# Patient Record
Sex: Male | Born: 1990
Health system: Southern US, Community
[De-identification: ages and names within clinical notes are randomized; demographics above are authoritative.]

## PROBLEM LIST (undated history)

## (undated) HISTORY — PX: APPENDECTOMY: SHX54

---

## 2013-06-06 ENCOUNTER — Emergency Department (HOSPITAL_COMMUNITY)
Admission: EM | Admit: 2013-06-06 | Discharge: 2013-06-06 | Disposition: A | Discharge status: Home / Self Care | Payer: Medicaid Other | Attending: Emergency Medicine | Admitting: Emergency Medicine

## 2013-06-06 ENCOUNTER — Encounter (HOSPITAL_COMMUNITY): Payer: Self-pay | Admitting: Emergency Medicine

## 2013-06-06 DIAGNOSIS — F17200 Nicotine dependence, unspecified, uncomplicated: Secondary | ICD-10-CM | POA: Insufficient documentation

## 2013-06-06 DIAGNOSIS — A630 Anogenital (venereal) warts: Secondary | ICD-10-CM | POA: Insufficient documentation

## 2013-06-06 LAB — GC/CHLAMYDIA PROBE AMP
CT Probe RNA: NEGATIVE
GC Probe RNA: NEGATIVE

## 2013-06-06 NOTE — ED Provider Notes (Signed)
CSN: [id number]     Arrival date & time 06/06/13  0122 History   First MD Initiated Contact with Patient 06/06/13 0143     Chief Complaint  Patient presents with  . Exposure to STD     (Consider location/radiation/quality/duration/timing/severity/associated sxs/prior Treatment) HPI History provided by pt.   Pt noticed bumps on his penis ~1 week ago.  Bumps are painful during sexual intercourse.  No associated fever, abdominal pain, testicular pain, urethral discharge or dysuria.  Has a single partner but does not use protection.  She is asymptomatic.  No h/o STDs.  History reviewed. No pertinent past medical history. Past Surgical History  Procedure Laterality Date  . Appendectomy     History reviewed. No pertinent family history. History  Substance Use Topics  . Smoking status: Current Every Day Smoker    Types: Cigarettes  . Smokeless tobacco: Not on file  . Alcohol Use: Yes    Review of Systems  All other systems reviewed and are negative.      Allergies  Review of patient's allergies indicates no known allergies.  Home Medications  No current outpatient prescriptions on file. BP 139/75  Pulse 64  Temp(Src) 98.4 F (36.9 C) (Oral)  Resp 18  SpO2 100% Physical Exam  Nursing note and vitals reviewed. Constitutional: He is oriented to person, place, and time. He appears well-developed and well-nourished. No distress.  HENT:  Head: Normocephalic and atraumatic.  Eyes:  Normal appearance  Neck: Normal range of motion.  Cardiovascular: Normal rate and regular rhythm.   Pulmonary/Chest: Effort normal and breath sounds normal. No respiratory distress.  Abdominal: Soft. Bowel sounds are normal. He exhibits no distension and no mass. There is no tenderness. There is no rebound and no guarding.  Genitourinary:  Multiple pink, flat, asymmetric papules ranging from 2-1064mm.  Non-tender.  Uncircumcised.  No urethral discharge.  Testicles descended bilaterally.  No masses or  tenderness.  No inguinal lymphadenopathy.  No perianal rash.  Musculoskeletal: Normal range of motion.  Neurological: He is alert and oriented to person, place, and time.  Skin: Skin is warm and dry. No rash noted.  Psychiatric: He has a normal mood and affect. His behavior is normal.    ED Course  Procedures (including critical care time) Labs Review Labs Reviewed  GC/CHLAMYDIA PROBE AMP   Imaging Review No results found.  EKG Interpretation   None       MDM   Final diagnoses:  Genital warts    23yo healthy M presents w/ genitalia rash.  Exam most consistent w/ HPV.  GC/chlam culture pending.  Recommended tylenol/motrin prn and f/u w/ Health Dept for further testing and possibly tx of warts.  Also recommended that his girlfriend be evaluated and that they use protection when having intercourse.  Return precautions discussed.     Otilio Miuatherine E Curt Oatis, PA-C 06/06/13 [phone]

## 2013-06-06 NOTE — Discharge Instructions (Signed)
Treat pain w/ motrin or tylenol.  You can alternate these two medications every three hours if necessary.   Follow up with the health department for further STD testing.  They may be able to treat your genital warts as well.  Return to the ER if you develop worsening rash or associated fever or abdominal pain.  Genital Warts Genital warts are caused by a germ (human papillomavirus, HPV). This germ is spread by having unprotected sex (intercourse) with an infected person. It can be spread by vaginal, anal, and oral sex. A person who is infected might not show any signs or problems. HOME CARE  Follow your doctor's treatment instructions.  Do not use medicine that is meant for hand warts. Only take medicine as told by your doctor.  Tell your past and current sex partner(s) that you have genital warts. They may need treatment.  Avoid sexual contact while you are being treated.  Do not touch or scratch the warts. You could spread it to other parts of your body.  Women with genital warts should have a cervical cancer check (Pap test) at least once a year.  Tell your doctor if you become pregnant. The germ can be passed to the baby.  After treatment, use a condom during sex.  Ask your doctor before using anti-itch creams. GET HELP RIGHT AWAY IF:   Your treated skin becomes red, puffy (swollen), or painful.  You have a fever.  You feel generally sick.  You feel little lumps in and around your genital area.  You are bleeding or have pain during sex. MAKE SURE YOU:   Understand these instructions.  Will watch your condition.  Will get help right away if you are not doing well or get worse. Document Released: 06/21/2009 Document Revised: 06/19/2011 Document Reviewed: 10/03/2010 [facility] Patient Information 2014 [place], [place].

## 2013-06-06 NOTE — ED Provider Notes (Signed)
Medical screening examination/treatment/procedure(s) were performed by non-physician practitioner and as supervising physician I was immediately available for consultation/collaboration.    Trent Theisen M Perpetua Elling, MD 06/06/13 0405 

## 2013-06-06 NOTE — ED Notes (Signed)
Pt states he wants to be checked for a std

## 2014-12-18 ENCOUNTER — Encounter (HOSPITAL_COMMUNITY): Payer: Self-pay | Admitting: *Deleted

## 2014-12-18 ENCOUNTER — Emergency Department (HOSPITAL_COMMUNITY)
Admission: EM | Admit: 2014-12-18 | Discharge: 2014-12-18 | Disposition: A | Discharge status: Home / Self Care | Payer: Medicaid Other | Attending: Emergency Medicine | Admitting: Emergency Medicine

## 2014-12-18 DIAGNOSIS — L600 Ingrowing nail: Secondary | ICD-10-CM | POA: Insufficient documentation

## 2014-12-18 DIAGNOSIS — Z720 Tobacco use: Secondary | ICD-10-CM | POA: Insufficient documentation

## 2014-12-18 NOTE — ED Notes (Signed)
Pt reports in-grown toe nail x 10 years.

## 2014-12-18 NOTE — Discharge Instructions (Signed)
Ingrown Toenail An ingrown toenail occurs when the sharp edge of your toenail grows into the skin. Causes of ingrown toenails include toenails clipped too far back or poorly fitting shoes. Activities involving sudden stops (basketball, tennis) causing "toe jamming" may lead to an ingrown nail. HOME CARE INSTRUCTIONS   Soak the whole foot in warm soapy water for 20 minutes, 3 times per day.  You may lift the edge of the nail away from the sore skin by wedging a small piece of cotton under the corner of the nail. Be careful not to dig (traumatize) and cause more injury to the area.  Wear shoes that fit well. While the ingrown nail is causing problems, sandals may be beneficial.  Trim your toenails regularly and carefully. Cut your toenails straight across, not in a curve. This will prevent injury to the skin at the corners of the toenail.  Keep your feet clean and dry.  Crutches may be helpful early in treatment if walking is painful.  Antibiotics, if prescribed, should be taken as directed.  Return for a wound check in 2 days or as directed.  Only take over-the-counter or prescription medicines for pain, discomfort, or fever as directed by your caregiver. SEEK IMMEDIATE MEDICAL CARE IF:   You have a fever.  You have increasing pain, redness, swelling, or heat at the wound site.  Your toe is not better in 7 days. If conservative treatment is not successful, surgical removal of a portion or all of the nail may be necessary. MAKE SURE YOU:   Understand these instructions.  Will watch your condition.  Will get help right away if you are not doing well or get worse. Document Released: 03/24/2000 Document Revised: 06/19/2011 Document Reviewed: 03/18/2008 [facility] Patient Information 2015 [place], [place]. This information is not intended to replace advice given to you by your health care provider. Make sure you discuss any questions you have with your health care provider. : Make an  appointment with the foot Center.  They can take your now off or help you throw it out so it does not curl under  As it is right now.  There is no sign of infection at this time

## 2014-12-18 NOTE — ED Notes (Signed)
Pt assessed and discharged by Kathreen Cornfield

## 2014-12-18 NOTE — ED Provider Notes (Signed)
CSN: [id number]     Arrival date & time 12/18/14  0021 History   First MD Initiated Contact with Patient 12/18/14 0034     No chief complaint on file.    (Consider location/radiation/quality/duration/timing/severity/associated sxs/prior Treatment) HPI Comments: , and states that he's had trouble with his right great toe for the past 10 years.  Tonight, he would like his toenail taken off and he is tired of dealing with.  The history is provided by the patient.    No past medical history on file. Past Surgical History  Procedure Laterality Date  . Appendectomy     No family history on file. Social History  Substance Use Topics  . Smoking status: Current Every Day Smoker    Types: Cigarettes  . Smokeless tobacco: Not on file  . Alcohol Use: Yes    Review of Systems  Constitutional: Negative for fever.  Musculoskeletal: Negative for joint swelling.  Skin: Negative for wound.  All other systems reviewed and are negative.     Allergies  Review of patient's allergies indicates no known allergies.  Home Medications   Prior to Admission medications   Not on File   BP 135/81 mmHg  Pulse 69  Temp(Src) 98.7 F (37.1 C) (Oral)  Resp 18  SpO2 95% Physical Exam  Constitutional: He appears well-developed and well-nourished.  HENT:  Head: Normocephalic.  Eyes: Pupils are equal, round, and reactive to light.  Neck: Normal range of motion.  Cardiovascular: Normal rate.   Pulmonary/Chest: Effort normal.  Musculoskeletal: He exhibits no edema or tenderness.       Feet:  Neurological: He is alert.  Skin: Skin is warm.  Nursing note and vitals reviewed.   ED Course  Procedures (including critical care time) Labs Review Labs Reviewed - No data to display  Imaging Review No results found. I have personally reviewed and evaluated these images and lab results as part of my medical decision-making.   EKG Interpretation None     Patient has been given referral to  [facility]   Final diagnoses:  Ingrown toenail without infection         Earley Favor, NP 12/18/14 0043  April Palumbo, MD 12/18/14 [id number]

## 2018-01-03 ENCOUNTER — Emergency Department (HOSPITAL_BASED_OUTPATIENT_CLINIC_OR_DEPARTMENT_OTHER): Payer: Self-pay

## 2018-01-03 ENCOUNTER — Encounter (HOSPITAL_BASED_OUTPATIENT_CLINIC_OR_DEPARTMENT_OTHER): Payer: Self-pay | Admitting: Emergency Medicine

## 2018-01-03 ENCOUNTER — Other Ambulatory Visit: Payer: Self-pay

## 2018-01-03 ENCOUNTER — Emergency Department (HOSPITAL_BASED_OUTPATIENT_CLINIC_OR_DEPARTMENT_OTHER)
Admission: EM | Admit: 2018-01-03 | Discharge: 2018-01-03 | Disposition: A | Discharge status: Home / Self Care | Payer: Self-pay | Attending: Emergency Medicine | Admitting: Emergency Medicine

## 2018-01-03 DIAGNOSIS — K2100 Gastro-esophageal reflux disease with esophagitis, without bleeding: Secondary | ICD-10-CM

## 2018-01-03 DIAGNOSIS — F17210 Nicotine dependence, cigarettes, uncomplicated: Secondary | ICD-10-CM | POA: Insufficient documentation

## 2018-01-03 DIAGNOSIS — R072 Precordial pain: Secondary | ICD-10-CM | POA: Insufficient documentation

## 2018-01-03 IMAGING — DX DG CHEST 2V
2 series · 2 of 2 positions shown · non-contrast
Comparison: None.

CLINICAL DATA: Intermittent chest pain and nausea for the past 2
days. Smoker.

EXAM:
CHEST - 2 VIEW

[chest pa]
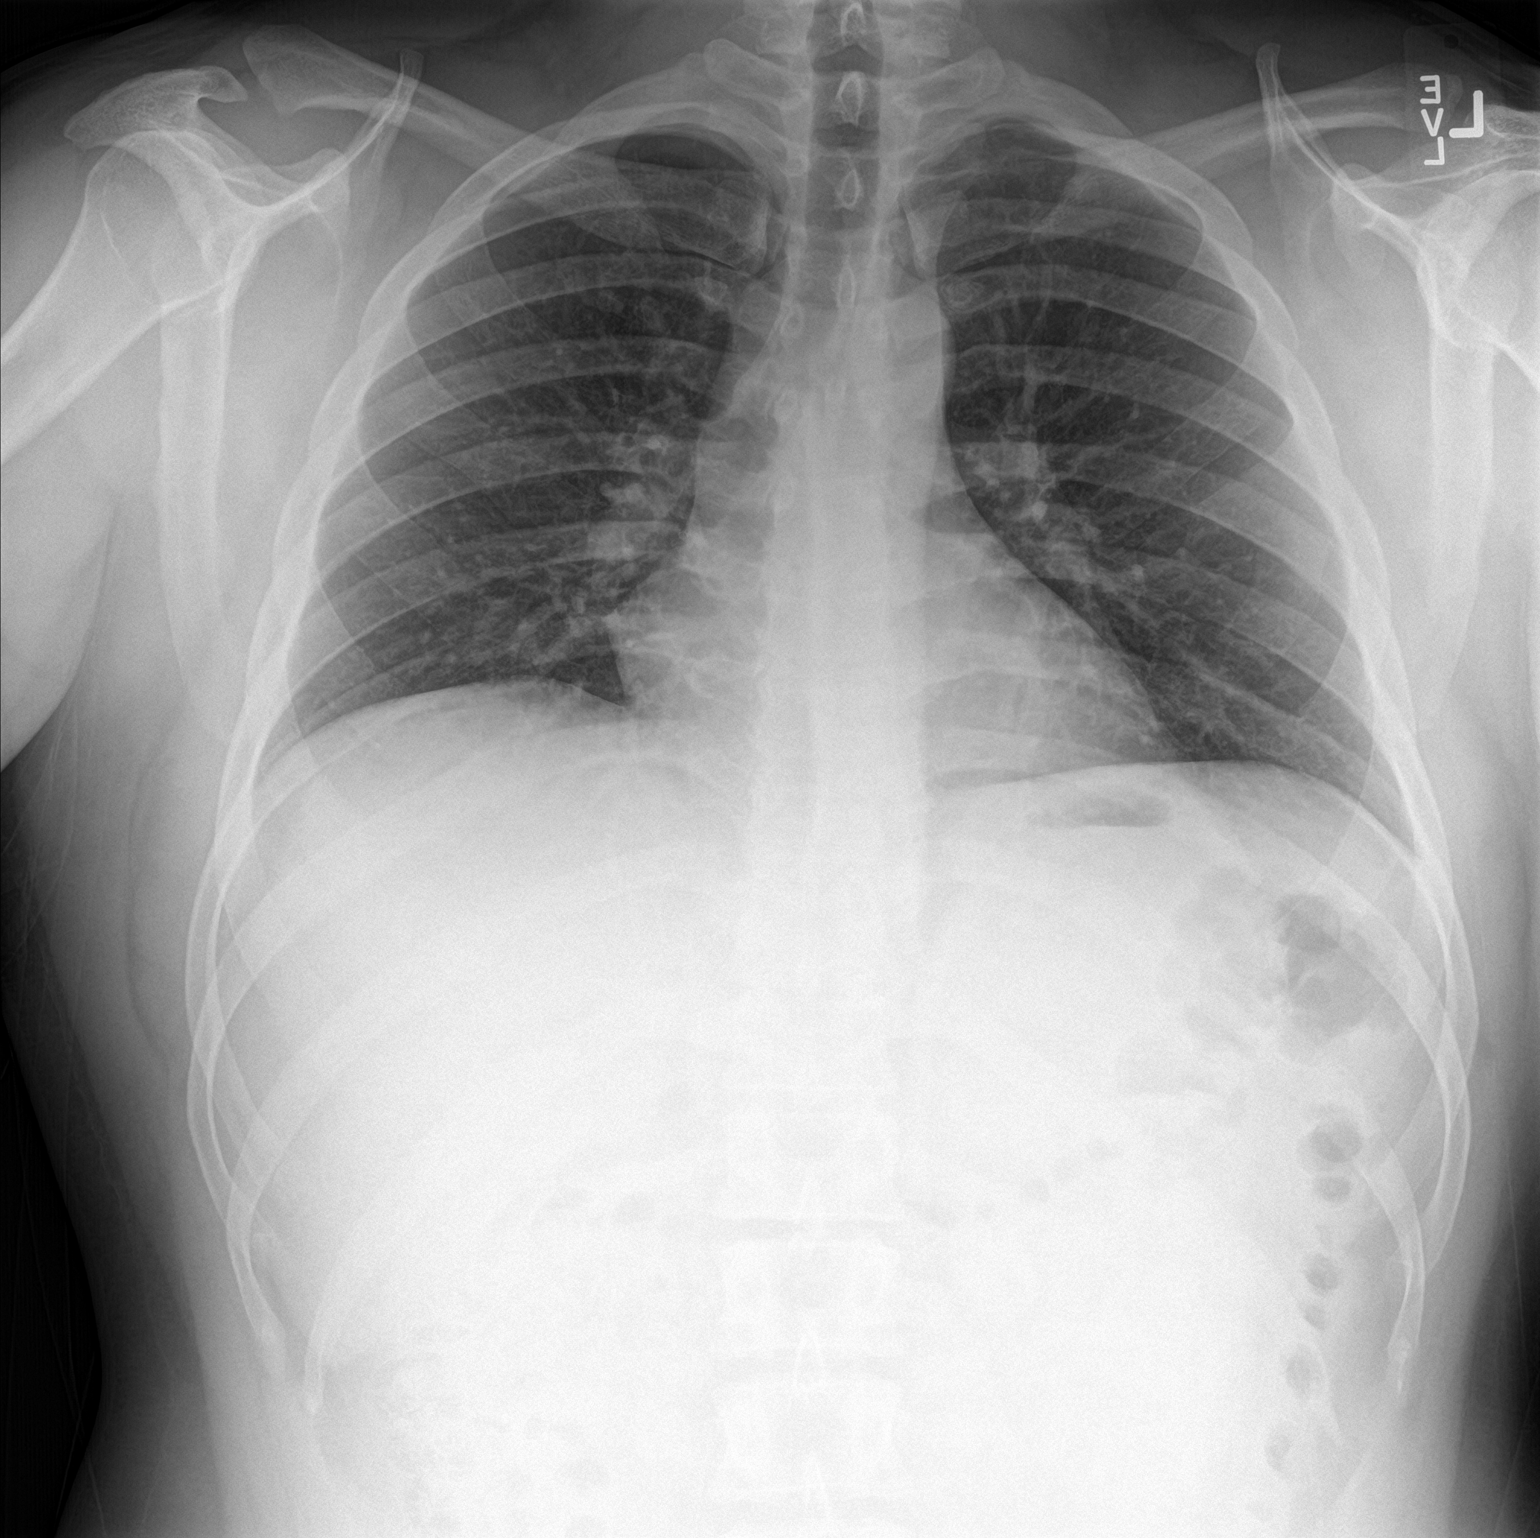

[chest lat]
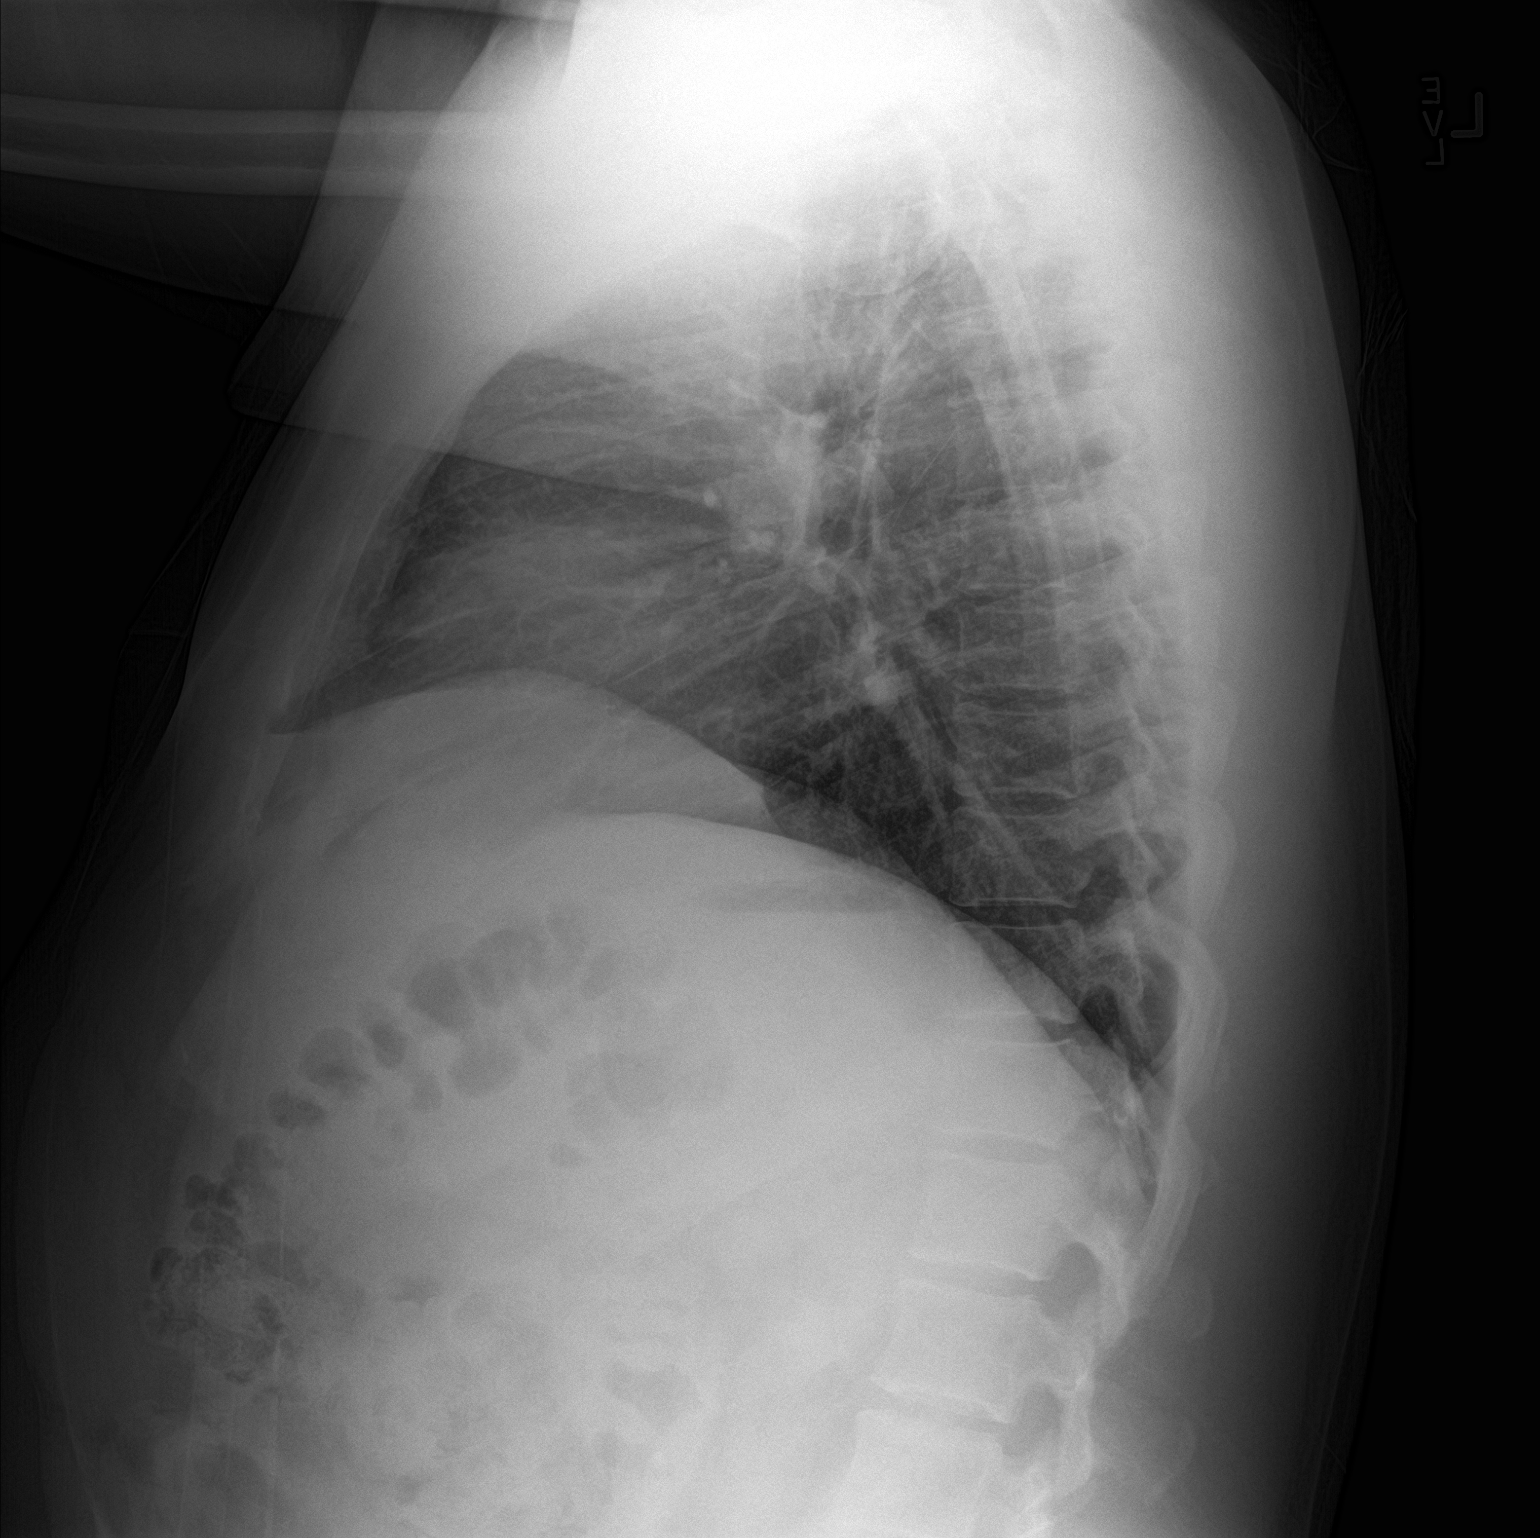

[2 of 2 positions shown; findings below may reference images not displayed]

FINDINGS: Poor inspiration. Normal sized heart. Mild peribronchial thickening.
Clear lungs. Normal appearing bones.
IMPRESSION: Mild bronchitic changes.

## 2018-01-03 MED ORDER — OMEPRAZOLE 20 MG PO CPDR: 20.0000 mg | DELAYED_RELEASE_CAPSULE | Freq: Every day | ORAL | 0 refills | Status: AC

## 2018-01-03 NOTE — ED Triage Notes (Signed)
Intermittent chest pain x2 days with nausea. 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 Denies pain at this time.

## 2018-01-03 NOTE — Discharge Instructions (Addendum)
Please read and follow all provided instructions.  Your diagnoses today include:  1. Gastroesophageal reflux disease with esophagitis   2. Precordial pain    Work up here is reassuring. Chest x-ray did not show concerning findings. EKG was normal. No signs of a heart attack. We suspect you have acid reflux and GERD causing your symptoms. Please take omeprazole for at least two weeks and you can use tums for acute symptoms relief. Please avoid spicy, greasy and fatty foods. Please follow up with primary care or call the [facility] for an appointment.    Tests performed today include: An EKG of your heart A chest x-ray Vital signs. See below for your results today.   Medications prescribed:   Take any prescribed medications only as directed.  Follow-up instructions: Please follow-up with your primary care provider as soon as you can for further evaluation of your symptoms.   Return instructions:  SEEK IMMEDIATE MEDICAL ATTENTION IF: You have severe chest pain, especially if the pain is crushing or pressure-like and spreads to the arms, back, neck, or jaw, or if you have sweating, nausea (feeling sick to your stomach), or shortness of breath. THIS IS AN EMERGENCY. Don't wait to see if the pain will go away. Get medical help at once. Call 911 or 0 (operator). DO NOT drive yourself to the hospital.  Your chest pain gets worse and does not go away with rest.  You have an attack of chest pain lasting longer than usual, despite rest and treatment with the medications your caregiver has prescribed.  You wake from sleep with chest pain or shortness of breath. You feel dizzy or faint. You have chest pain not typical of your usual pain for which you originally saw your caregiver.  You have any other emergent concerns regarding your health.  Additional Information: Chest pain comes from many different causes. Your caregiver has diagnosed you as having chest pain that is not specific for one  problem, but does not require admission.  You are at low risk for an acute heart condition or other serious illness.   Your vital signs today were: BP (!) 168/102 (BP Location: Left Arm)    Pulse 91    Temp 98.3 F (36.8 C) (Oral)    Resp 18    Wt 104.1 kg    SpO2 97%  If your blood pressure (BP) was elevated above 135/85 this visit, please have this repeated by your doctor within one month. --------------

## 2018-01-03 NOTE — ED Provider Notes (Signed)
[facility] EMERGENCY DEPARTMENT Provider Note   CSN: [id number] Arrival date & time: 01/03/18  1739     History   Chief Complaint Chief Complaint  Patient presents with  . Chest Pain    HPI Wesley Contreras is a 27 y.o. male.  Wesley Contreras is a 27 y.o. Male who presents to the ED complaining of intermittent chest pain for several months.  Patient reports he has intermittent abdominal pain and chest pain with associated burping and belching ongoing for several months.  He is not able to identify alleviating or aggravating factors.  He tells me that right now he does not have any symptoms.  He has not been taking any treatments for his symptoms at home.  He has had no coughing or shortness of breath.  He is not a smoker.  He has not had any vomiting or diarrhea.  He has been eating and drinking normally.  No history of MI. No recent long travel. No SOB. No fevers, coughing, shortness of breath, vomiting, diarrhea, rashes, leg pain, leg swelling, syncope.   The history is provided by the patient. No language interpreter was used.  Chest Pain   Associated symptoms include abdominal pain (not currently. ). Pertinent negatives include no back pain, no cough, no fever, no headaches, no nausea, no palpitations, no shortness of breath and no vomiting.    History reviewed. No pertinent past medical history.  There are no active problems to display for this patient.   Past Surgical History:  Procedure Laterality Date  . APPENDECTOMY          Home Medications    Prior to Admission medications   Medication Sig Start Date End Date Taking? Authorizing Provider  omeprazole (PRILOSEC) 20 MG capsule Take 1 capsule (20 mg total) by mouth daily. 01/03/18   Everlene Farrier, PA-C    Family History No family history on file.  Social History Social History   Tobacco Use  . Smoking status: Current Every Day Smoker    Types: Cigarettes  . Smokeless tobacco: Never Used  Substance Use  Topics  . Alcohol use: Yes  . Drug use: No     Allergies   Patient has no known allergies.   Review of Systems Review of Systems  Constitutional: Negative for appetite change, chills and fever.  HENT: Negative for congestion and sore throat.   Eyes: Negative for visual disturbance.  Respiratory: Negative for cough, shortness of breath and wheezing.   Cardiovascular: Positive for chest pain (not currently. ). Negative for palpitations and leg swelling.  Gastrointestinal: Positive for abdominal pain (not currently. ). Negative for diarrhea, nausea and vomiting.  Genitourinary: Negative for dysuria.  Musculoskeletal: Negative for back pain and neck pain.  Skin: Negative for rash.  Neurological: Negative for syncope, light-headedness and headaches.     Physical Exam Updated Vital Signs BP (!) 135/93   Pulse 78   Temp 98.3 F (36.8 C) (Oral)   Resp 12   Wt 104.1 kg   SpO2 97%   Physical Exam  Constitutional: He appears well-developed and well-nourished.  Non-toxic appearance. He does not appear ill. No distress.  HENT:  Head: Normocephalic and atraumatic.  Mouth/Throat: Oropharynx is clear and moist.  Eyes: Pupils are equal, round, and reactive to light. Conjunctivae are normal. Right eye exhibits no discharge. Left eye exhibits no discharge.  Neck: Neck supple.  Cardiovascular: Normal rate, regular rhythm, normal heart sounds and intact distal pulses. Exam reveals no gallop and no  friction rub.  No murmur heard. Bilateral radial, posterior tibialis and dorsalis pedis pulses are intact.    Pulmonary/Chest: Effort normal and breath sounds normal. No respiratory distress. He has no wheezes. He has no rales.  Lungs are clear to ascultation bilaterally. Symmetric chest expansion bilaterally. No increased work of breathing. No rales or rhonchi.    Abdominal: Soft. He exhibits no distension. There is no tenderness. There is no guarding.  Abdomen is soft and nontender to  palpation.  Musculoskeletal: He exhibits no edema.       Right lower leg: Normal. He exhibits no tenderness and no edema.       Left lower leg: Normal. He exhibits no tenderness and no edema.  Lymphadenopathy:    He has no cervical adenopathy.  Neurological: He is alert. Coordination normal.  Skin: Skin is warm and dry. Capillary refill takes less than 2 seconds. No rash noted. He is not diaphoretic. No erythema. No pallor.  Psychiatric: He has a normal mood and affect. His behavior is normal.  Nursing note and vitals reviewed.    ED Treatments / Results  Labs (all labs ordered are listed, but only abnormal results are displayed) Labs Reviewed - No data to display  EKG ED ECG REPORT   Date: 01/03/2018  Rate: 91  Rhythm: normal sinus rhythm  QRS Axis: normal  Intervals: normal  ST/T Wave abnormalities: normal  Conduction Disutrbances:none  Old EKG Reviewed: none available  I have personally reviewed the EKG tracing and agree with the computerized printout as noted.   Radiology Dg Chest 2 View  Result Date: 01/03/2018 CLINICAL DATA:  Intermittent chest pain and nausea for the past 2 days. Smoker. EXAM: CHEST - 2 VIEW COMPARISON:  None. FINDINGS: Poor inspiration. Normal sized heart. Mild peribronchial thickening. Clear lungs. Normal appearing bones. IMPRESSION: Mild bronchitic changes. Electronically Signed   By: Beckie Salts M.D.   On: 01/03/2018 18:11    Procedures Procedures (including critical care time)  Medications Ordered in ED Medications - No data to display   Initial Impression / Assessment and Plan / ED Course  I have reviewed the triage vital signs and the nursing notes.  Pertinent labs & imaging results that were available during my care of the patient were reviewed by me and considered in my medical decision making (see chart for details).    This  is a 27 y.o. Male who presents to the ED complaining of intermittent chest pain for several months.   Patient reports he has intermittent abdominal pain and chest pain with associated burping and belching ongoing for several months.  He is not able to identify alleviating or aggravating factors.  He tells me that right now he does not have any symptoms.  He has not been taking any treatments for his symptoms at home.  He has had no coughing or shortness of breath.  He is not a smoker.  He has not had any vomiting or diarrhea.  He has been eating and drinking normally.  No history of MI. No recent long travel. No SOB.  He reports he was born with a hole in his heart. He reports he was followed as a kid and it closed on its own.  On exam the patient is afebrile nontoxic-appearing.  His abdomen is soft and nontender to palpation.  Lungs are clear to auscultation bilaterally.  EKG shows normal sinus rhythm.  No evidence of STEMI.  Chest x-ray shows mild bronchitic changes.  Patient does not  have signs or symptoms of bronchitis at this time.  He seems to have symptoms of acid reflux with esophagitis.  He has burping and belching.  Intermittent abdominal pain that is in his upper abdomen he thinks.  Is been ongoing for several months.  I offered him a GI cocktail.  He declines.  He is not having any symptoms right now.  I see no need for additional work-up.  Will discharge with a course of omeprazole and have him follow-up with primary care.  Strict and specific return precautions discussed. I advised the patient to follow-up with their primary care provider this week. I advised the patient to return to the emergency department with new or worsening symptoms or new concerns. The patient verbalized understanding and agreement with plan.    Final Clinical Impressions(s) / ED Diagnoses   Final diagnoses:  Gastroesophageal reflux disease with esophagitis  Precordial pain    ED Discharge Orders         Ordered    omeprazole (PRILOSEC) 20 MG capsule  Daily     01/03/18 1844           Everlene Farrier,  PA-C 01/03/18 1857    Melene Plan, DO 01/03/18 2004

## 2018-04-25 ENCOUNTER — Emergency Department (HOSPITAL_COMMUNITY): Payer: Self-pay

## 2018-04-25 ENCOUNTER — Emergency Department (HOSPITAL_COMMUNITY)
Admission: EM | Admit: 2018-04-25 | Discharge: 2018-04-25 | Disposition: A | Discharge status: Home / Self Care | Payer: Self-pay | Attending: Emergency Medicine | Admitting: Emergency Medicine

## 2018-04-25 DIAGNOSIS — Z79899 Other long term (current) drug therapy: Secondary | ICD-10-CM | POA: Insufficient documentation

## 2018-04-25 DIAGNOSIS — F17210 Nicotine dependence, cigarettes, uncomplicated: Secondary | ICD-10-CM | POA: Insufficient documentation

## 2018-04-25 DIAGNOSIS — R0789 Other chest pain: Secondary | ICD-10-CM | POA: Insufficient documentation

## 2018-04-25 DIAGNOSIS — M546 Pain in thoracic spine: Secondary | ICD-10-CM | POA: Insufficient documentation

## 2018-04-25 LAB — BASIC METABOLIC PANEL
Anion gap: 10 (ref 5–15)
BUN: 11 mg/dL (ref 6–20)
CALCIUM: 9.8 mg/dL (ref 8.9–10.3)
CO2: 27 mmol/L (ref 22–32)
CREATININE: 0.95 mg/dL (ref 0.61–1.24)
Chloride: 104 mmol/L (ref 98–111)
GFR calc Af Amer: >60 mL/min (ref >60)
GLUCOSE: 83 mg/dL (ref 70–99)
Potassium: 3.5 mmol/L (ref 3.5–5.1)
Sodium: 141 mmol/L (ref 135–145)

## 2018-04-25 LAB — CBC
HCT: 49.3 % (ref 39.0–52.0)
Hemoglobin: 16.6 g/dL (ref 13.0–17.0)
MCH: 29.6 pg (ref 26.0–34.0)
MCHC: 33.7 g/dL (ref 30.0–36.0)
MCV: 88 fL (ref 80.0–100.0)
Platelets: 295 10*3/uL (ref 150–400)
RBC: 5.6 MIL/uL (ref 4.22–5.81)
RDW: 12.3 % (ref 11.5–15.5)
WBC: 8.4 10*3/uL (ref 4.0–10.5)
nRBC: 0 % (ref 0.0–0.2)

## 2018-04-25 LAB — I-STAT TROPONIN, ED
TROPONIN I, POC: 0 ng/mL (ref 0.00–0.08)
Troponin i, poc: 0 ng/mL (ref 0.00–0.08)

## 2018-04-25 IMAGING — DX DG CHEST 2V
2 series · 2 of 2 positions shown · non-contrast
Comparison: None.

CLINICAL DATA: Intermittent chest/back pain

EXAM:
CHEST - 2 VIEW

[chest pa]
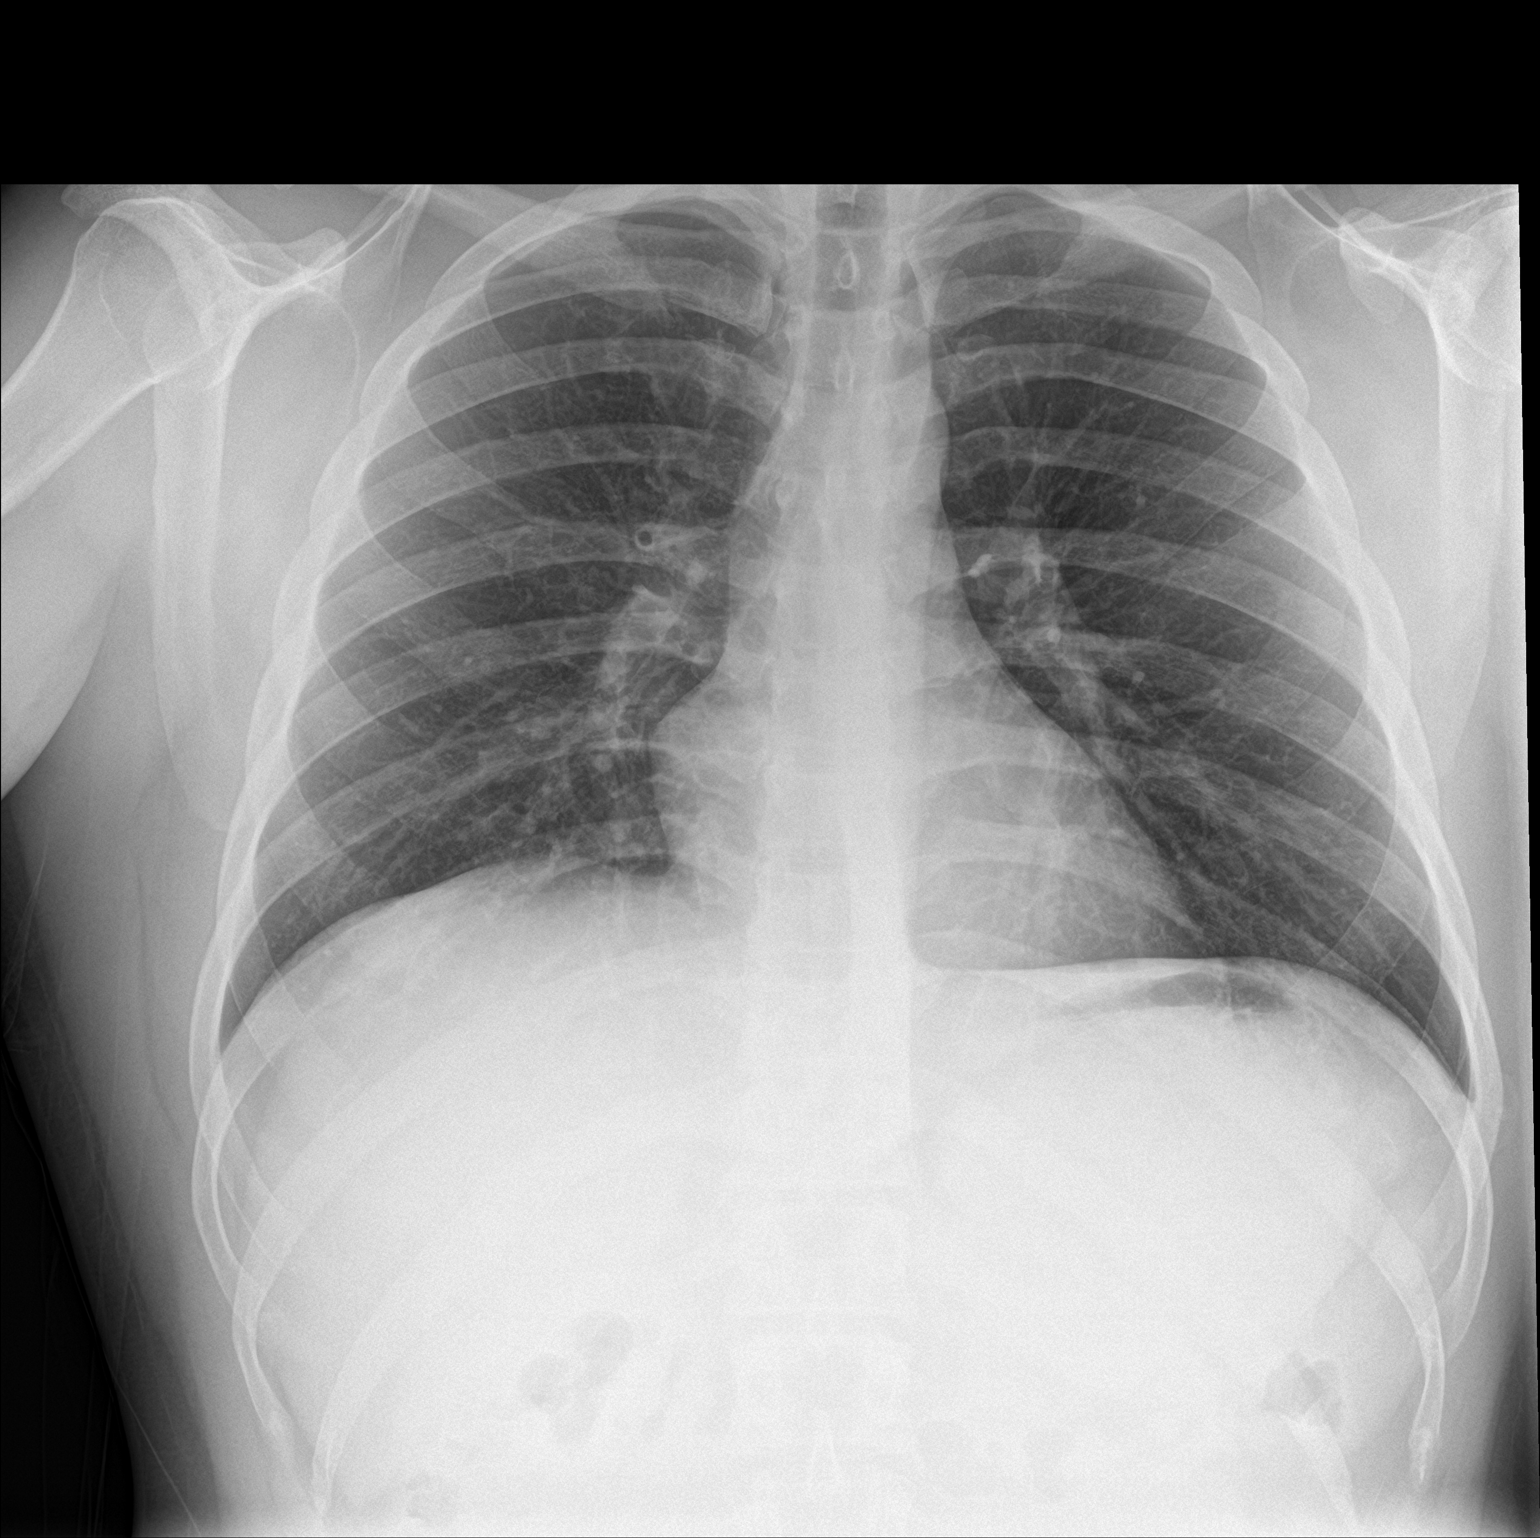

[chest lat]
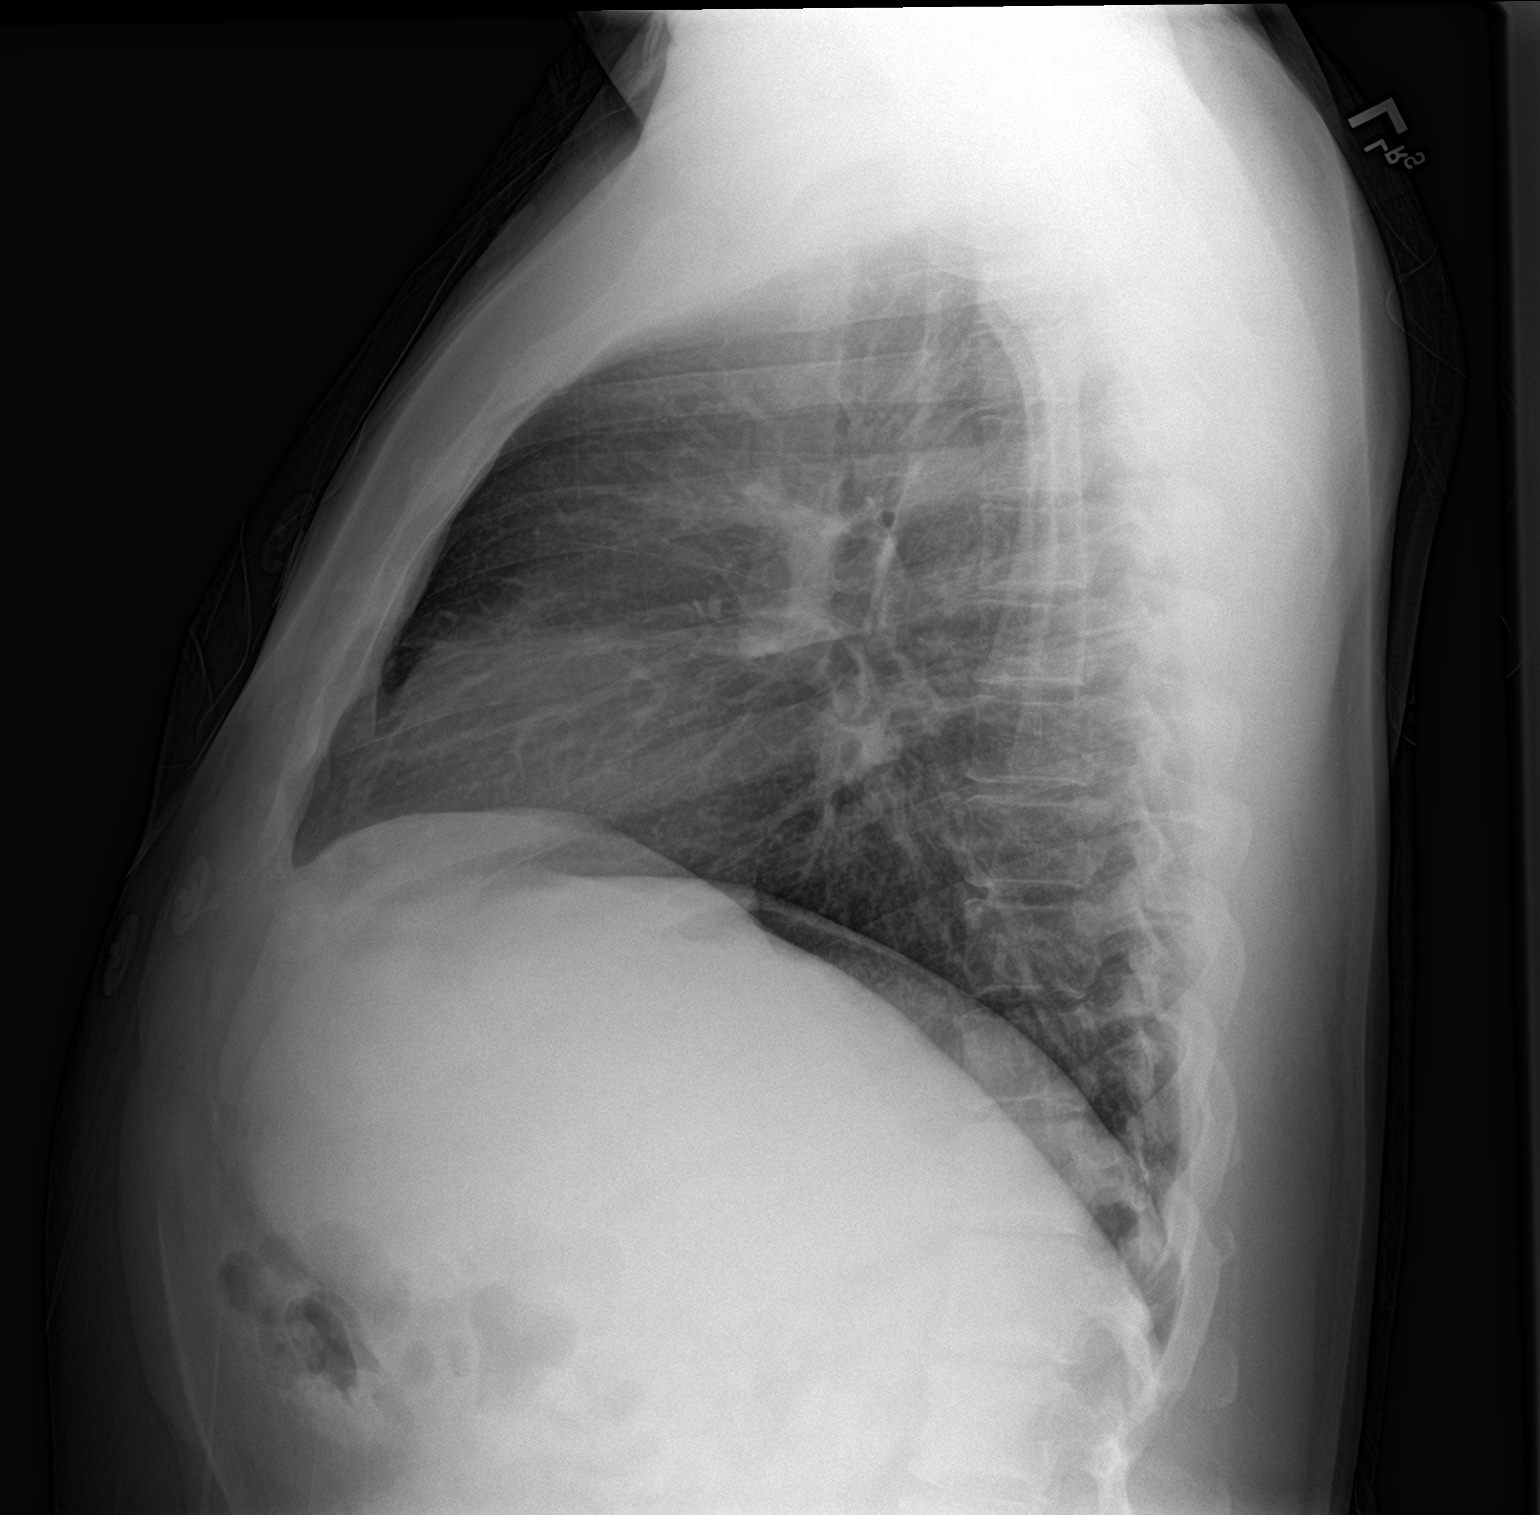

[2 of 2 positions shown; findings below may reference images not displayed]

FINDINGS: Lungs are clear.  No pleural effusion or pneumothorax.

The heart is normal in size.

Visualized osseous structures are within normal limits.
IMPRESSION: Normal chest radiographs.

## 2018-04-25 MED ORDER — SODIUM CHLORIDE 0.9% FLUSH: 3.0000 mL | Freq: Once | INTRAVENOUS | Status: DC

## 2018-04-25 NOTE — ED Provider Notes (Signed)
[facility] Provider Note   CSN: [id number] Arrival date & time: 04/25/18  0301     History   Chief Complaint Chief Complaint  Patient presents with  . Chest Pain    [facility] a 28 y.o. male.  The history is provided by the patient. No language interpreter was used.  Chest Pain  Associated symptoms: back pain      28 year old male with history of tobacco use presenting to the ED with complaints of chest pain.  Patient states for the past 3 weeks he has had persistent pain primarily involving his left upper back as well as intermittent midsternal chest pain.  States the back pain is what bothers him the most.  Pain is described as a stabbing sensation, nonradiating, worsening with movement, and has been persistent, pain is mild in severity.  His chest pain is a sporadic discomfort midsternal abdomen usually seconds to minute, without any aggravating or alleviating factor.  No active chest pain at this time.  There is no associated fever, chills, lightheadedness, dizziness, diaphoresis, nausea or shortness of breath.  He did recall falling and injuring his left upper back several months ago and noticed a big bruise which has since resolved.  He denies alcohol tobacco abuse or any significant family history of heart disease.  He denies any prior history of PE or DVT, no recent surgery, prolonged bedrest, or active cancer.  He does not have a primary care provider.  No past medical history on file.  There are no active problems to display for this patient.   Past Surgical History:  Procedure Laterality Date  . APPENDECTOMY          Home Medications    Prior to Admission medications   Medication Sig Start Date End Date Taking? Authorizing Provider  omeprazole (PRILOSEC) 20 MG capsule Take 1 capsule (20 mg total) by mouth daily. 01/03/18   Everlene Farrieransie, William, PA-C    Family History No family history on file.  Social  History Social History   Tobacco Use  . Smoking status: Current Every Day Smoker    Types: Cigarettes  . Smokeless tobacco: Never Used  Substance Use Topics  . Alcohol use: Yes  . Drug use: No     Allergies   Patient has no known allergies.   Review of Systems Review of Systems  Cardiovascular: Positive for chest pain.  Musculoskeletal: Positive for back pain.  All other systems reviewed and are negative.    Physical Exam Updated Vital Signs BP (!) 154/101   Pulse 98   Temp 98.4 F (36.9 C) (Oral)   Resp 18   SpO2 99%   Physical Exam Vitals signs and nursing note reviewed.  Constitutional:      General: He is not in acute distress.    Appearance: He is well-developed.  HENT:     Head: Atraumatic.  Eyes:     Conjunctiva/sclera: Conjunctivae normal.  Neck:     Musculoskeletal: Neck supple.  Cardiovascular:     Rate and Rhythm: Normal rate and regular rhythm.  Pulmonary:     Effort: Pulmonary effort is normal.     Breath sounds: Normal breath sounds.  Abdominal:     Tenderness: There is no abdominal tenderness.  Musculoskeletal:        General: No swelling or tenderness (No significant reproducible tenderness to back or chest on palpation.).  Skin:    Findings: No rash.  Neurological:  Mental Status: He is alert and oriented to person, place, and time.      ED Treatments / Results  Labs (all labs ordered are listed, but only abnormal results are displayed) Labs Reviewed  BASIC METABOLIC PANEL  CBC  I-STAT TROPONIN, ED  I-STAT TROPONIN, ED    EKG EKG Interpretation  Date/Time:  Thursday April 25 2018 03:13:57 EST Ventricular Rate:  88 PR Interval:  122 QRS Duration: 92 QT Interval:  346 QTC Calculation: 418 R Axis:   96 Text Interpretation:  Normal sinus rhythm with sinus arrhythmia Rightward axis Nonspecific T wave abnormality Abnormal ECG Nonspecific T wave abnormality Confirmed by Glynn Octave [phone]) on 04/25/2018 5:47:19  AM   Radiology Dg Chest 2 View  Result Date: 04/25/2018 CLINICAL DATA:  Intermittent chest/back pain EXAM: CHEST - 2 VIEW COMPARISON:  None. FINDINGS: Lungs are clear.  No pleural effusion or pneumothorax. The heart is normal in size. Visualized osseous structures are within normal limits. IMPRESSION: Normal chest radiographs. Electronically Signed   By: Charline Bills M.D.   On: 04/25/2018 03:34    Procedures Procedures (including critical care time)  Medications Ordered in ED Medications - No data to display   Initial Impression / Assessment and Plan / ED Course  I have reviewed the triage vital signs and the nursing notes.  Pertinent labs & imaging results that were available during my care of the patient were reviewed by me and considered in my medical decision making (see chart for details).     BP (!) 154/101   Pulse 98   Temp 98.4 F (36.9 C) (Oral)   Resp 18   SpO2 99%    Final Clinical Impressions(s) / ED Diagnoses   Final diagnoses:  Atypical chest pain  Acute left-sided thoracic back pain    ED Discharge Orders    None     6:15 AM Patient with persistent left upper back pain worsened with movement.  No significant discomfort on exam.  No evidence of bruising or rash noted.  Suspect this is likely musculoskeletal pain since patient had a prior fall.  X-ray unremarkable.  Patient also complaining of intermittent midsternal chest pain.  Pain is atypical of ACS.  Initially blood pressure is mildly elevated at 154/101 however on recheck, his blood pressure normalized.  EKG, troponin, and labs are reassuring.  At this time, no acute emergent medical condition identified.  Encourage patient to follow-up with primary care provider for further evaluation.  Return precaution discussed.  Also low suspicion for PE.   Fayrene Helper, PA-C 04/25/18 0631    Glynn Octave, MD 04/25/18 [phone]

## 2018-04-25 NOTE — ED Triage Notes (Signed)
Pt reports intermittent CP and mid back pain X3 weeks. Diaphoretic and hypertensive in triage.

## 2019-07-04 ENCOUNTER — Emergency Department (HOSPITAL_BASED_OUTPATIENT_CLINIC_OR_DEPARTMENT_OTHER): Payer: Medicaid Other

## 2019-07-04 ENCOUNTER — Emergency Department (HOSPITAL_BASED_OUTPATIENT_CLINIC_OR_DEPARTMENT_OTHER)
Admission: EM | Admit: 2019-07-04 | Discharge: 2019-07-04 | Disposition: A | Discharge status: Home / Self Care | Payer: Medicaid Other | Attending: Emergency Medicine | Admitting: Emergency Medicine

## 2019-07-04 ENCOUNTER — Other Ambulatory Visit: Payer: Self-pay

## 2019-07-04 ENCOUNTER — Encounter (HOSPITAL_BASED_OUTPATIENT_CLINIC_OR_DEPARTMENT_OTHER): Payer: Self-pay

## 2019-07-04 DIAGNOSIS — Z87891 Personal history of nicotine dependence: Secondary | ICD-10-CM | POA: Insufficient documentation

## 2019-07-04 DIAGNOSIS — K659 Peritonitis, unspecified: Secondary | ICD-10-CM | POA: Insufficient documentation

## 2019-07-04 DIAGNOSIS — R1084 Generalized abdominal pain: Secondary | ICD-10-CM

## 2019-07-04 DIAGNOSIS — K6389 Other specified diseases of intestine: Secondary | ICD-10-CM

## 2019-07-04 LAB — COMPREHENSIVE METABOLIC PANEL
ALT: 37 U/L (ref 0–44)
AST: 28 U/L (ref 15–41)
Albumin: 4.2 g/dL (ref 3.5–5.0)
Alkaline Phosphatase: 42 U/L (ref 38–126)
Anion gap: 8 (ref 5–15)
BUN: 10 mg/dL (ref 6–20)
CO2: 24 mmol/L (ref 22–32)
Calcium: 8.6 mg/dL — ABNORMAL LOW (ref 8.9–10.3)
Chloride: 104 mmol/L (ref 98–111)
Creatinine, Ser: 0.75 mg/dL (ref 0.61–1.24)
GFR calc Af Amer: >60 mL/min (ref >60)
GFR calc non Af Amer: >60 mL/min (ref >60)
Glucose, Bld: 94 mg/dL (ref 70–99)
Potassium: 3.6 mmol/L (ref 3.5–5.1)
Sodium: 136 mmol/L (ref 135–145)
Total Bilirubin: 1.1 mg/dL (ref 0.3–1.2)
Total Protein: 7.2 g/dL (ref 6.5–8.1)

## 2019-07-04 LAB — CBC WITH DIFFERENTIAL/PLATELET
Abs Immature Granulocytes: 0.03 10*3/uL (ref 0.00–0.07)
Basophils Absolute: 0.1 10*3/uL (ref 0.0–0.1)
Basophils Relative: 1 %
Eosinophils Absolute: 0.1 10*3/uL (ref 0.0–0.5)
Eosinophils Relative: 1 %
HCT: 43.4 % (ref 39.0–52.0)
Hemoglobin: 15 g/dL (ref 13.0–17.0)
Immature Granulocytes: 0 %
Lymphocytes Relative: 22 %
Lymphs Abs: 2.2 10*3/uL (ref 0.7–4.0)
MCH: 30.7 pg (ref 26.0–34.0)
MCHC: 34.6 g/dL (ref 30.0–36.0)
MCV: 88.8 fL (ref 80.0–100.0)
Monocytes Absolute: 1.1 10*3/uL — ABNORMAL HIGH (ref 0.1–1.0)
Monocytes Relative: 11 %
Neutro Abs: 6.4 10*3/uL (ref 1.7–7.7)
Neutrophils Relative %: 65 %
Platelets: 263 10*3/uL (ref 150–400)
RBC: 4.89 MIL/uL (ref 4.22–5.81)
RDW: 12.2 % (ref 11.5–15.5)
WBC: 9.9 10*3/uL (ref 4.0–10.5)
nRBC: 0 % (ref 0.0–0.2)

## 2019-07-04 LAB — URINALYSIS, ROUTINE W REFLEX MICROSCOPIC
Bilirubin Urine: NEGATIVE
Glucose, UA: NEGATIVE mg/dL
Hgb urine dipstick: NEGATIVE
Ketones, ur: NEGATIVE mg/dL
Leukocytes,Ua: NEGATIVE
Nitrite: NEGATIVE
Protein, ur: NEGATIVE mg/dL
Specific Gravity, Urine: >1.03 — ABNORMAL HIGH (ref 1.005–1.030)
pH: 5.5 (ref 5.0–8.0)

## 2019-07-04 LAB — LIPASE, BLOOD: Lipase: 24 U/L (ref 11–51)

## 2019-07-04 IMAGING — CT CT ABD-PELV W/ CM
2 of 4 series · 16 of 46 positions shown, 18 images · IV contrast (Omnipaque)
Comparison: None.

CLINICAL DATA: Right-sided abdominal pain for 3 days. History of
prior appendectomy.

EXAM:
CT ABDOMEN AND PELVIS WITH CONTRAST
TECHNIQUE: Multidetector CT imaging of the abdomen and pelvis was performed
using the standard protocol following bolus administration of
intravenous contrast.
CONTRAST:  100mL OMNIPAQUE IOHEXOL 300 MG/ML  SOLN

[Series 2: axial st · axial · 0.98mm/px · z∈[-511,-31]mm · 13 of 106 slices shown, 15 images]
[im 5/106  soft-tissue]
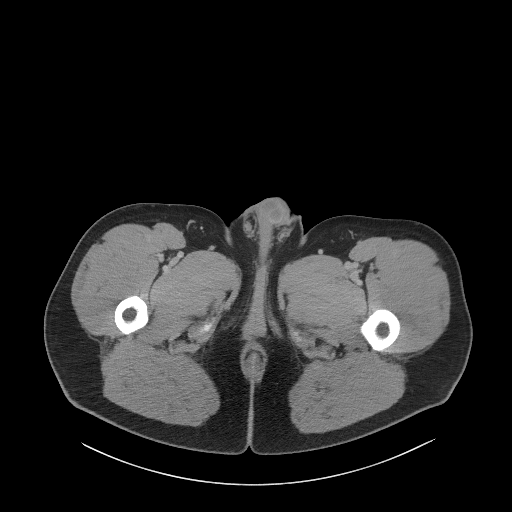
[im 5/106  bone]
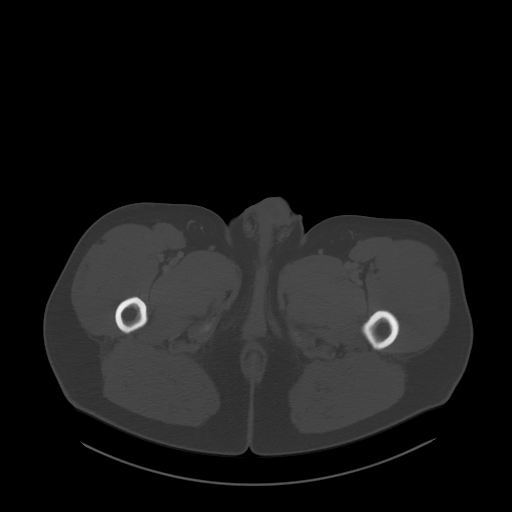
[im 13/106  soft-tissue]
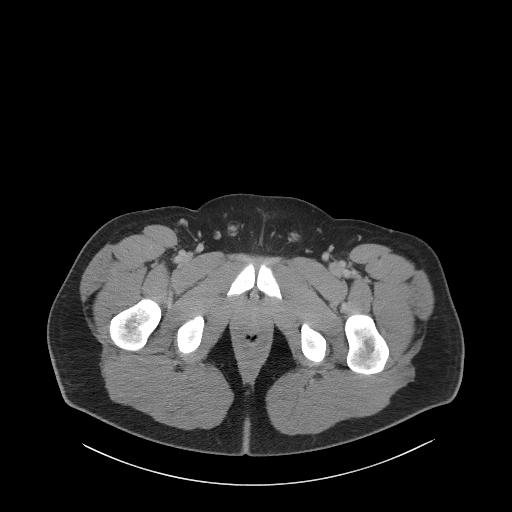
[im 22/106  soft-tissue]
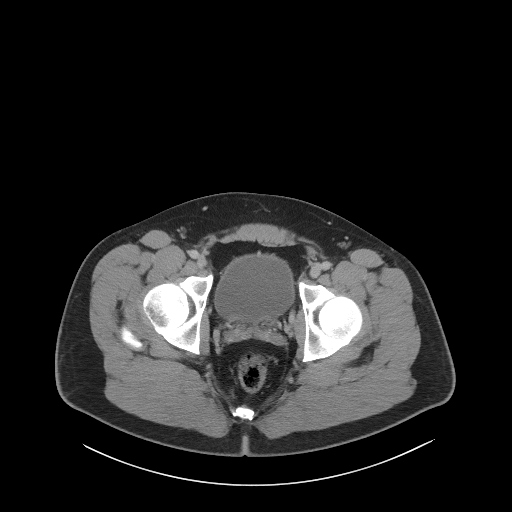
[im 30/106  soft-tissue]
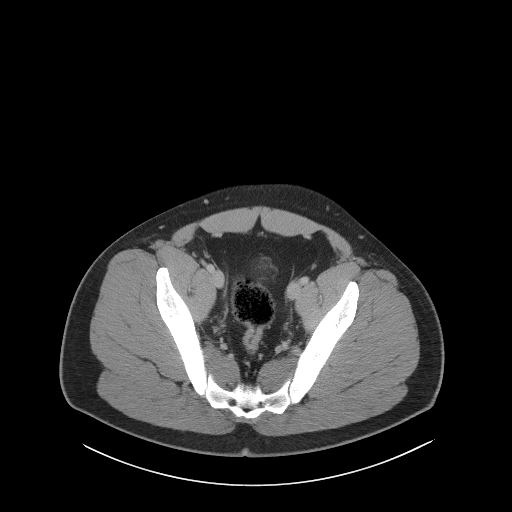
[im 38/106  soft-tissue]
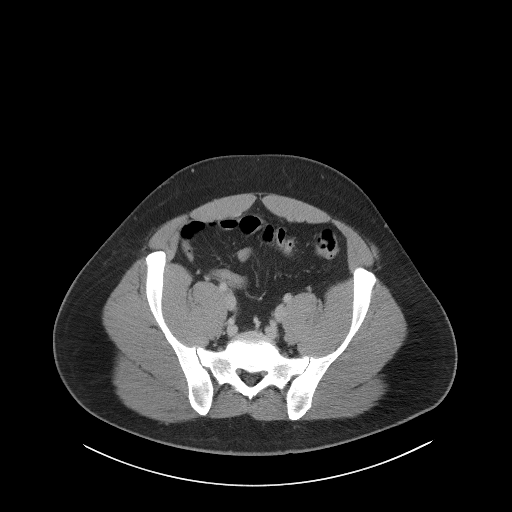
[im 47/106  soft-tissue]
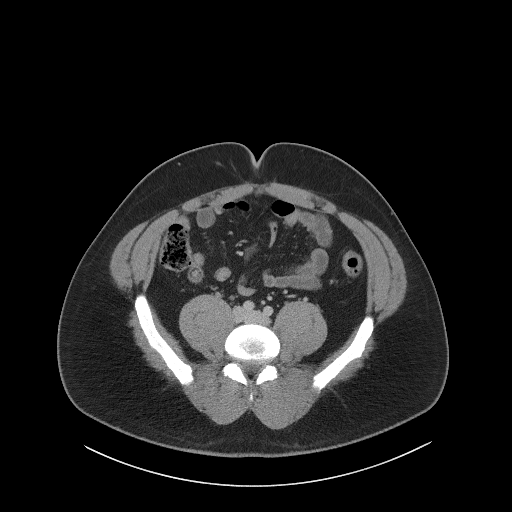
[im 55/106  soft-tissue]
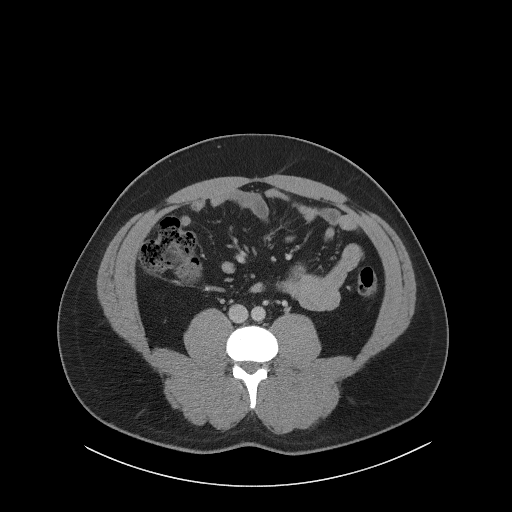
[im 59/106  soft-tissue]
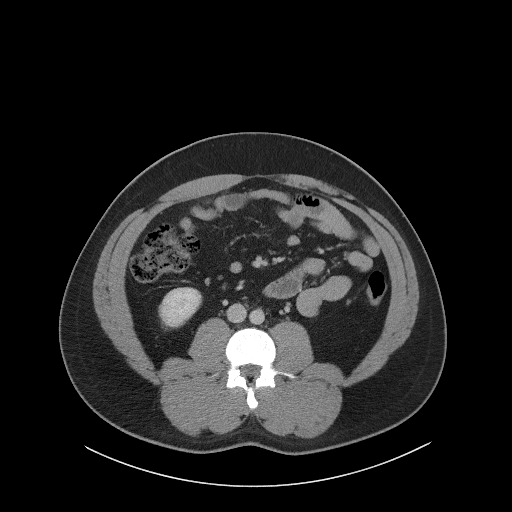
[im 68/106  soft-tissue]
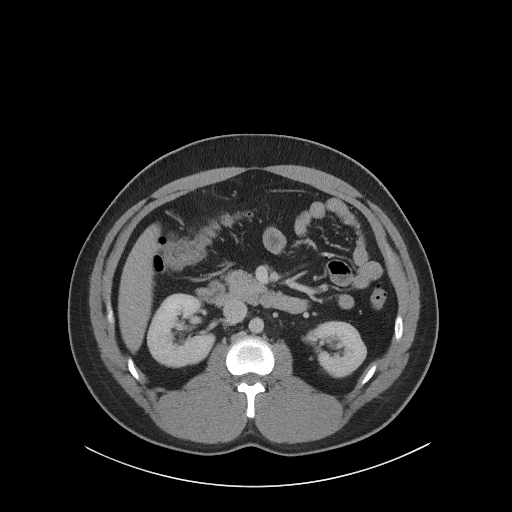
[im 68/106  bone]
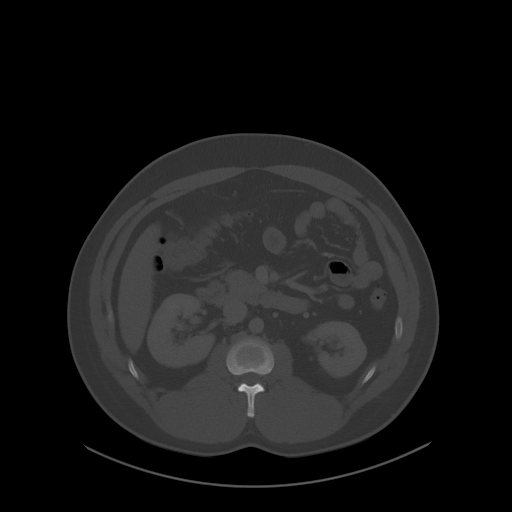
[im 76/106  soft-tissue]
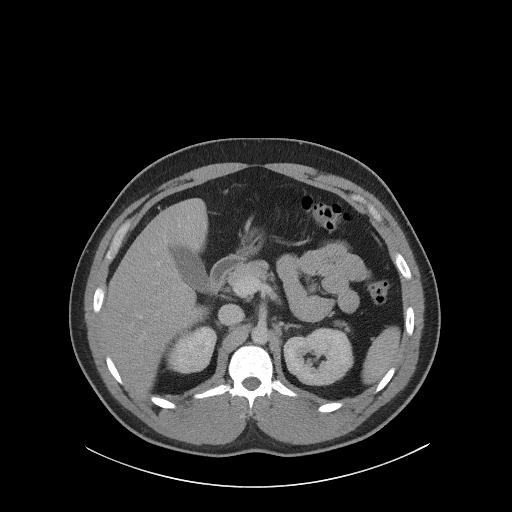
[im 85/106  soft-tissue]
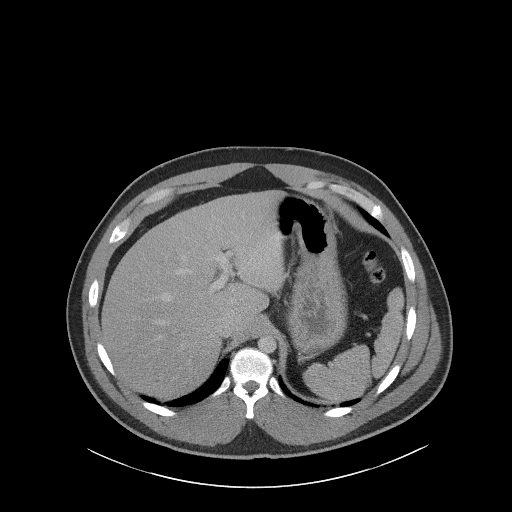
[im 93/106  soft-tissue]
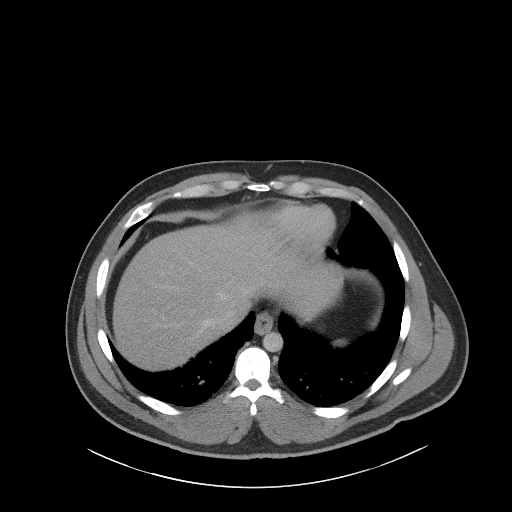
[im 101/106  soft-tissue]
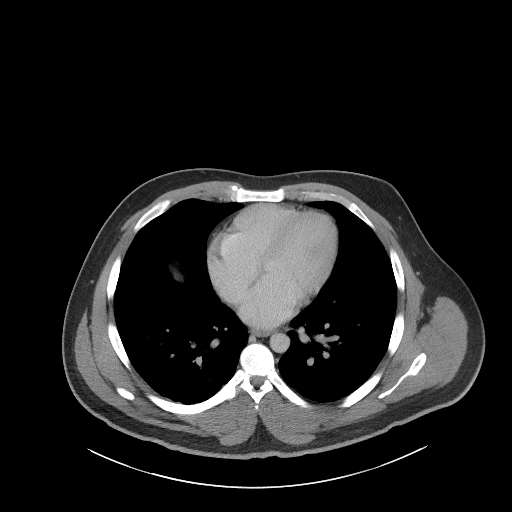

[Series 5: coronal st · coronal · 0.84mm/px · 3 of 109 slices shown]
[im 37/109  soft-tissue]
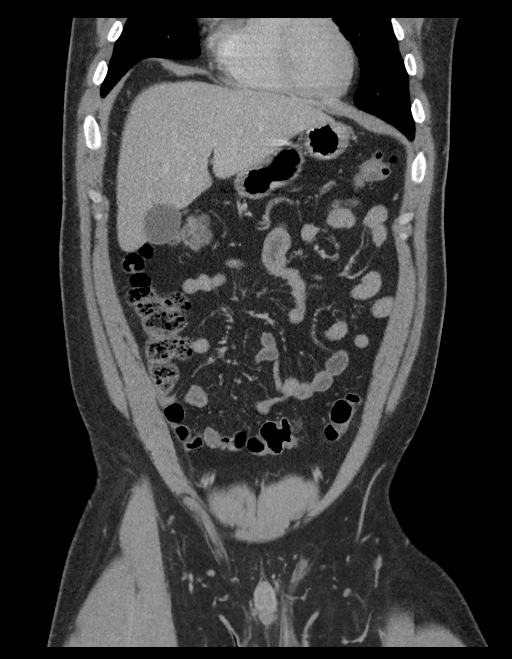
[im 49/109  soft-tissue]
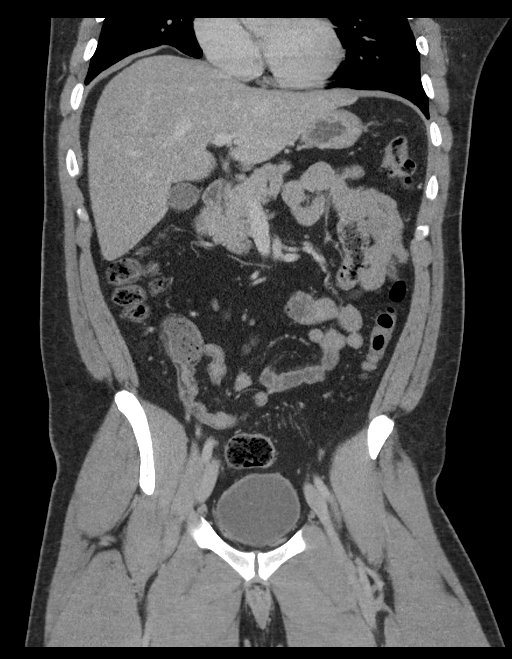
[im 61/109  soft-tissue]
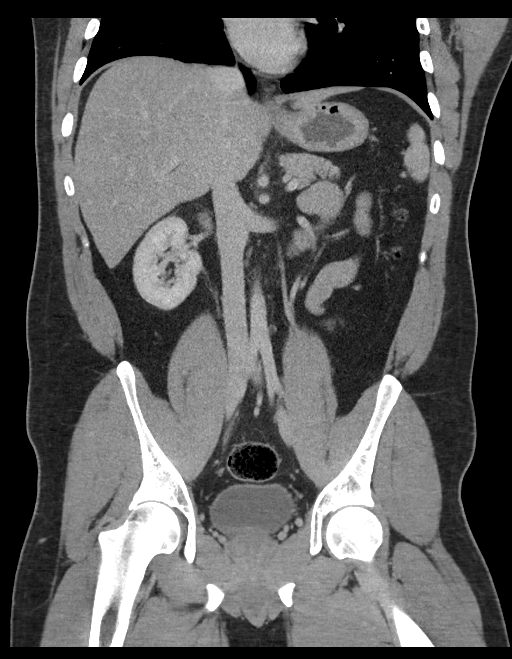

[16 of 46 positions shown; findings below may reference images not displayed]

FINDINGS: Lower chest: Patchy bibasilar atelectasis. No infiltrates or
effusions. The heart is normal in size. No pericardial effusion. The
distal esophagus is unremarkable.

Hepatobiliary: No focal hepatic lesions or intrahepatic biliary
dilatation. The gallbladder is normal. No common bile duct
dilatation.

Pancreas: No mass, inflammation or ductal dilatation.

Spleen: Normal size. No focal lesions.

Adrenals/Urinary Tract: The adrenal glands and kidneys are
unremarkable. No renal, ureteral or bladder calculi or mass. No
findings for pyelonephritis.

Stomach/Bowel: The stomach, duodenum, small bowel and colon are
grossly normal without oral contrast. No acute inflammatory changes,
mass lesions or obstructive findings. The terminal ileum is
unremarkable. The appendix is surgically absent. Scattered colonic
diverticulosis but no findings for acute diverticulitis.

Vascular/Lymphatic: The aorta and branch vessels are normal. The
major venous structures are patent.

No mesenteric or retroperitoneal mass or adenopathy.

Reproductive: The prostate gland and seminal vesicles are
unremarkable.

Other: There is a focal area of inflammatory type interstitial
changes in the omentum on the right side. Findings most likely
secondary to a small omental infarct or epiploic appendagitis. This
is a benign self-limited process.

Musculoskeletal: No significant bony findings.
IMPRESSION: 1. Focal area of inflammatory type interstitial changes in the
omentum on the right side most likely secondary to a small omental
infarct or epiploic appendagitis.
2. No other significant abdominal/pelvic findings, mass lesions or
adenopathy.

## 2019-07-04 MED ORDER — SODIUM CHLORIDE 0.9 % IV BOLUS
1000.0000 mL | Freq: Once | INTRAVENOUS | Status: AC
  Administered 2019-07-04: 1000 mL via INTRAVENOUS

## 2019-07-04 MED ORDER — IOHEXOL 300 MG/ML  SOLN
100.0000 mL | Freq: Once | INTRAMUSCULAR | Status: AC | PRN
  Administered 2019-07-04: 100 mL via INTRAVENOUS

## 2019-07-04 MED ORDER — MORPHINE SULFATE (PF) 4 MG/ML IV SOLN
4.0000 mg | Freq: Once | INTRAVENOUS | Status: AC
  Administered 2019-07-04: 17:00:00 4 mg via INTRAVENOUS
  Filled 2019-07-04: qty 1

## 2019-07-04 MED ORDER — HYDROCODONE-ACETAMINOPHEN 5-325 MG PO TABS: 1.0000 | ORAL_TABLET | Freq: Four times a day (QID) | ORAL | 0 refills | Status: DC | PRN

## 2019-07-04 NOTE — ED Triage Notes (Signed)
pt c/o right side abd pain x 3 days-denies n/v/d-NAD-steady gait-stood in triage

## 2019-07-04 NOTE — Discharge Instructions (Addendum)
Today you received medications that may make you sleepy or impair your ability to make decisions.  For the next 24 hours please do not drive, operate heavy machinery, care for a small child with out another adult present, or perform any activities that may cause harm to you or someone else if you were to fall asleep or be impaired.   You are being prescribed a medication which may make you sleepy. Please follow up of listed precautions for at least 24 hours after taking one dose.  Please take 1 capful of MiraLAX every day for the next 3 weeks.  Your CT scan also showed that you have a large amount of stool on the right side of your abdomen which may be contributing to your symptoms.

## 2019-07-04 NOTE — ED Provider Notes (Signed)
[facility] EMERGENCY DEPARTMENT Provider Note   CSN: [id number] Arrival date & time: 07/04/19  1332     History Chief Complaint  Patient presents with  . Abdominal Pain    Wesley Contreras is a 29 y.o. male medical history status post appendectomy as a child who presents today for evaluation of right-sided abdominal pain. He reports that over the past 3 days he has had pain in his right sided abdomen.  It is made worse with movement.  It feels tight.  He is taken Tylenol without significant relief of his symptoms.  His pain has not radiated or moved.  His last bowel movement was this morning and was normal for him.  He denies any nausea, vomiting, or diarrhea.  No constipation.  He denies dysuria, increased frequency or urgency.  He denies any recent trauma.  No fevers at home or known sick contacts.  He denies regular alcohol use or cannabis use.  HPI     History reviewed. No pertinent past medical history.  There are no problems to display for this patient.   Past Surgical History:  Procedure Laterality Date  . APPENDECTOMY         No family history on file.  Social History   Tobacco Use  . Smoking status: Former Smoker    Types: Cigarettes  . Smokeless tobacco: Never Used  Substance Use Topics  . Alcohol use: Not Currently  . Drug use: No    Home Medications Prior to Admission medications   Medication Sig Start Date End Date Taking? Authorizing Provider  HYDROcodone-acetaminophen (NORCO/VICODIN) 5-325 MG tablet Take 1 tablet by mouth every 6 (six) hours as needed for severe pain. 07/04/19   Lorin Glass, PA-C  omeprazole (PRILOSEC) 20 MG capsule Take 1 capsule (20 mg total) by mouth daily. 01/03/18   Waynetta Pean, PA-C    Allergies    Patient has no known allergies.  Review of Systems   Review of Systems  Constitutional: Negative for chills, fatigue and fever.  Respiratory: Negative for cough, chest tightness and shortness of  breath.   Cardiovascular: Negative for chest pain.  Gastrointestinal: Positive for abdominal distention and abdominal pain. Negative for blood in stool, constipation, diarrhea, nausea, rectal pain and vomiting.  Genitourinary: Negative for dysuria.  Musculoskeletal: Negative for back pain and neck pain.  Neurological: Negative for weakness and headaches.  Psychiatric/Behavioral: Negative for confusion.  All other systems reviewed and are negative.   Physical Exam Updated Vital Signs BP 121/80 (BP Location: Right Arm)   Pulse 72   Temp 98.3 F (36.8 C) (Oral)   Resp 14   Ht 5\' 9"  (1.753 m)   Wt 103.4 kg   SpO2 97%   BMI 33.67 kg/m   Physical Exam Vitals and nursing note reviewed.  Constitutional:      Appearance: He is well-developed.  HENT:     Head: Normocephalic and atraumatic.  Eyes:     Conjunctiva/sclera: Conjunctivae normal.  Cardiovascular:     Rate and Rhythm: Normal rate and regular rhythm.     Heart sounds: Normal heart sounds. No murmur.  Pulmonary:     Effort: Pulmonary effort is normal. No respiratory distress.     Breath sounds: Normal breath sounds.  Abdominal:     General: Bowel sounds are normal. There is no distension.     Palpations: Abdomen is soft.     Tenderness: There is abdominal tenderness in the right upper quadrant, right lower quadrant, epigastric  area and periumbilical area. There is no guarding or rebound.     Hernia: No hernia is present.  Musculoskeletal:     Cervical back: Neck supple.  Skin:    General: Skin is warm and dry.  Neurological:     General: No focal deficit present.     Mental Status: He is alert.  Psychiatric:        Mood and Affect: Mood normal.        Behavior: Behavior normal.     ED Results / Procedures / Treatments   Labs (all labs ordered are listed, but only abnormal results are displayed) Labs Reviewed  COMPREHENSIVE METABOLIC PANEL - Abnormal; Notable for the following components:      Result Value    Calcium 8.6 (*)    All other components within normal limits  CBC WITH DIFFERENTIAL/PLATELET - Abnormal; Notable for the following components:   Monocytes Absolute 1.1 (*)    All other components within normal limits  URINALYSIS, ROUTINE W REFLEX MICROSCOPIC - Abnormal; Notable for the following components:   APPearance CLOUDY (*)    Specific Gravity, Urine >1.030 (*)    All other components within normal limits  LIPASE, BLOOD    EKG None  Radiology CT Abdomen Pelvis W Contrast  Result Date: 07/04/2019 CLINICAL DATA:  Right-sided abdominal pain for 3 days. History of prior appendectomy. EXAM: CT ABDOMEN AND PELVIS WITH CONTRAST TECHNIQUE: Multidetector CT imaging of the abdomen and pelvis was performed using the standard protocol following bolus administration of intravenous contrast. CONTRAST:  OMNIPAQUE IOHEXOL 300 MG/ML  SOLN COMPARISON:  None. FINDINGS: Lower chest: Patchy bibasilar atelectasis. No infiltrates or effusions. The heart is normal in size. No pericardial effusion. The distal esophagus is unremarkable. Hepatobiliary: No focal hepatic lesions or intrahepatic biliary dilatation. The gallbladder is normal. No common bile duct dilatation. Pancreas: No mass, inflammation or ductal dilatation. Spleen: Normal size. No focal lesions. Adrenals/Urinary Tract: The adrenal glands and kidneys are unremarkable. No renal, ureteral or bladder calculi or mass. No findings for pyelonephritis. Stomach/Bowel: The stomach, duodenum, small bowel and colon are grossly normal without oral contrast. No acute inflammatory changes, mass lesions or obstructive findings. The terminal ileum is unremarkable. The appendix is surgically absent. Scattered colonic diverticulosis but no findings for acute diverticulitis. Vascular/Lymphatic: The aorta and branch vessels are normal. The major venous structures are patent. No mesenteric or retroperitoneal mass or adenopathy. Reproductive: The prostate gland and  seminal vesicles are unremarkable. Other: There is a focal area of inflammatory type interstitial changes in the omentum on the right side. Findings most likely secondary to a small omental infarct or epiploic appendagitis. This is a benign self-limited process. Musculoskeletal: No significant bony findings. IMPRESSION: 1. Focal area of inflammatory type interstitial changes in the omentum on the right side most likely secondary to a small omental infarct or epiploic appendagitis. 2. No other significant abdominal/pelvic findings, mass lesions or adenopathy. Electronically Signed   By: Rudie Meyer M.D.   On: 07/04/2019 17:11    Procedures Procedures (including critical care time)  Medications Ordered in ED Medications  sodium chloride 0.9 % bolus 1,000 mL ( Intravenous Stopped 07/04/19 1605)  iohexol (OMNIPAQUE) 300 MG/ML solution 100 mL (100 mLs Intravenous Contrast Given 07/04/19 1650)  morphine 4 MG/ML injection 4 mg (4 mg Intravenous Given 07/04/19 1630)    ED Course  I have reviewed the triage vital signs and the nursing notes.  Pertinent labs & imaging results that were available during  my care of the patient were reviewed by me and considered in my medical decision making (see chart for details).    MDM Rules/Calculators/A&P                     Patient presents today for evaluation of 3 days of right-sided abdominal pain.  On exam he is generally tender on the right side of abdomen.  He is afebrile, not tachycardic or tachypneic.  CBC and CMP without significant hematologic or electrolyte derangements.  Lipase is not elevated.  UA shows concern for mild dehydration without evidence of infection. CT abdomen pelvis was obtained showing epiploic appendagitis versus a small omental infarct which, according to radiology, is benign and will self resolve. Patient's pain was treated while in the emergency room.  [facility] [place] PMP is consulted and he is given prescriptions for narcotics to help  manage his pain medicine at home after we discussed the risks.    Return precautions were discussed with patient who states their understanding.  At the time of discharge patient denied any unaddressed complaints or concerns.  Patient is agreeable for discharge home.  Note: Portions of this report may have been transcribed using voice recognition software. Every effort was made to ensure accuracy; however, inadvertent computerized transcription errors may be present  Final Clinical Impression(s) / ED Diagnoses Final diagnoses:  Epiploic appendagitis  Generalized abdominal pain    Rx / DC Orders ED Discharge Orders         Ordered    HYDROcodone-acetaminophen (NORCO/VICODIN) 5-325 MG tablet  Every 6 hours PRN     07/04/19 1735           Cristina Gong, PA-C 07/04/19 2302    Vanetta Mulders, MD 07/16/19 [phone]

## 2019-07-05 ENCOUNTER — Emergency Department (HOSPITAL_BASED_OUTPATIENT_CLINIC_OR_DEPARTMENT_OTHER)
Admission: EM | Admit: 2019-07-05 | Discharge: 2019-07-05 | Disposition: A | Discharge status: Home / Self Care | Payer: Medicaid Other | Attending: Emergency Medicine | Admitting: Emergency Medicine

## 2019-07-05 ENCOUNTER — Other Ambulatory Visit: Payer: Self-pay

## 2019-07-05 ENCOUNTER — Encounter (HOSPITAL_BASED_OUTPATIENT_CLINIC_OR_DEPARTMENT_OTHER): Payer: Self-pay

## 2019-07-05 DIAGNOSIS — Z87891 Personal history of nicotine dependence: Secondary | ICD-10-CM | POA: Insufficient documentation

## 2019-07-05 DIAGNOSIS — K388 Other specified diseases of appendix: Secondary | ICD-10-CM | POA: Insufficient documentation

## 2019-07-05 DIAGNOSIS — K6389 Other specified diseases of intestine: Secondary | ICD-10-CM

## 2019-07-05 LAB — COMPREHENSIVE METABOLIC PANEL
ALT: 41 U/L (ref 0–44)
AST: 30 U/L (ref 15–41)
Albumin: 4.1 g/dL (ref 3.5–5.0)
Alkaline Phosphatase: 50 U/L (ref 38–126)
Anion gap: 8 (ref 5–15)
BUN: 12 mg/dL (ref 6–20)
CO2: 24 mmol/L (ref 22–32)
Calcium: 8.5 mg/dL — ABNORMAL LOW (ref 8.9–10.3)
Chloride: 105 mmol/L (ref 98–111)
Creatinine, Ser: 0.8 mg/dL (ref 0.61–1.24)
GFR calc Af Amer: >60 mL/min (ref >60)
GFR calc non Af Amer: >60 mL/min (ref >60)
Glucose, Bld: 100 mg/dL — ABNORMAL HIGH (ref 70–99)
Potassium: 3.9 mmol/L (ref 3.5–5.1)
Sodium: 137 mmol/L (ref 135–145)
Total Bilirubin: 1 mg/dL (ref 0.3–1.2)
Total Protein: 7.3 g/dL (ref 6.5–8.1)

## 2019-07-05 LAB — CBC WITH DIFFERENTIAL/PLATELET
Abs Immature Granulocytes: 0.04 10*3/uL (ref 0.00–0.07)
Basophils Absolute: 0 10*3/uL (ref 0.0–0.1)
Basophils Relative: 0 %
Eosinophils Absolute: 0.1 10*3/uL (ref 0.0–0.5)
Eosinophils Relative: 1 %
HCT: 43.3 % (ref 39.0–52.0)
Hemoglobin: 15 g/dL (ref 13.0–17.0)
Immature Granulocytes: 0 %
Lymphocytes Relative: 14 %
Lymphs Abs: 1.5 10*3/uL (ref 0.7–4.0)
MCH: 30.5 pg (ref 26.0–34.0)
MCHC: 34.6 g/dL (ref 30.0–36.0)
MCV: 88.2 fL (ref 80.0–100.0)
Monocytes Absolute: 1.3 10*3/uL — ABNORMAL HIGH (ref 0.1–1.0)
Monocytes Relative: 12 %
Neutro Abs: 7.9 10*3/uL — ABNORMAL HIGH (ref 1.7–7.7)
Neutrophils Relative %: 73 %
Platelets: 251 10*3/uL (ref 150–400)
RBC: 4.91 MIL/uL (ref 4.22–5.81)
RDW: 12.1 % (ref 11.5–15.5)
WBC: 10.9 10*3/uL — ABNORMAL HIGH (ref 4.0–10.5)
nRBC: 0 % (ref 0.0–0.2)

## 2019-07-05 LAB — LIPASE, BLOOD: Lipase: 48 U/L (ref 11–51)

## 2019-07-05 MED ORDER — SODIUM CHLORIDE 0.9 % IV BOLUS (SEPSIS)
1000.0000 mL | Freq: Once | INTRAVENOUS | Status: AC
  Administered 2019-07-05: 1000 mL via INTRAVENOUS

## 2019-07-05 MED ORDER — POLYETHYLENE GLYCOL 3350 17 G PO PACK: 17.0000 g | PACK | Freq: Every day | ORAL | 0 refills | Status: AC

## 2019-07-05 MED ORDER — KETOROLAC TROMETHAMINE 15 MG/ML IJ SOLN
15.0000 mg | Freq: Once | INTRAMUSCULAR | Status: AC
  Administered 2019-07-05: 13:00:00 15 mg via INTRAVENOUS
  Filled 2019-07-05: qty 1

## 2019-07-05 NOTE — Discharge Instructions (Signed)
Your labs today were reassuring. Epiploic appendagitis is a very painful condition but will resolve on its own. It is important to drink plenty of fluids and take the mirilax as prescribed to help with bowel movements. You may take hydrocodone for pain but be aware that this may make your constipation worse. Your labs were very reassuring today. Thank you for allowing me to care for you today. Please return to the emergency department if you have new or worsening symptoms. Take your medications as instructed.

## 2019-07-05 NOTE — ED Notes (Signed)
Pt aware urine specimen ordered, pt verbalizes inability to use restroom at this time

## 2019-07-05 NOTE — ED Provider Notes (Signed)
[facility] EMERGENCY DEPARTMENT Provider Note   CSN: [id number] Arrival date & time: 07/05/19  1158     History Chief Complaint  Patient presents with  . Flank Pain    Wesley Contreras is a 29 y.o. male.  Patient is a 29 year old male with no significant past medical history presenting to the emergency department for abdominal pain and constipation.  Patient was seen yesterday for the same.  He had reassuring labs and a CT scan that showed epiploic appendagitis.  He reports that his pain seems to be getting worse and that he has not been able to have a bowel movement.  His last bowel movement was small and was yesterday afternoon.  He has taken only 1 dose of the Norco that he was prescribed.  He is able to eat and drink and had a meal of tacos last night.  Denies any dysuria or difficulty urinating.  Denies any fever, nausea, vomiting.        History reviewed. No pertinent past medical history.  There are no problems to display for this patient.   Past Surgical History:  Procedure Laterality Date  . APPENDECTOMY         No family history on file.  Social History   Tobacco Use  . Smoking status: Former Smoker    Types: Cigarettes  . Smokeless tobacco: Never Used  Substance Use Topics  . Alcohol use: Not Currently  . Drug use: No    Home Medications Prior to Admission medications   Medication Sig Start Date End Date Taking? Authorizing Provider  HYDROcodone-acetaminophen (NORCO/VICODIN) 5-325 MG tablet Take 1 tablet by mouth every 6 (six) hours as needed for severe pain. 07/04/19   Lorin Glass, PA-C  omeprazole (PRILOSEC) 20 MG capsule Take 1 capsule (20 mg total) by mouth daily. 01/03/18   Waynetta Pean, PA-C  polyethylene glycol (MIRALAX) 17 g packet Take 17 g by mouth daily. 07/05/19 08/04/19  Alveria Apley, PA-C    Allergies    Patient has no known allergies.  Review of Systems   Review of Systems  Constitutional: Negative for  appetite change, chills and fever.  HENT: Negative for congestion, ear pain and sore throat.   Eyes: Negative for pain and visual disturbance.  Respiratory: Negative for cough and shortness of breath.   Cardiovascular: Negative for chest pain and palpitations.  Gastrointestinal: Positive for abdominal pain and constipation. Negative for abdominal distention, anal bleeding, blood in stool, diarrhea, nausea and vomiting.  Genitourinary: Negative for dysuria, hematuria and urgency.  Musculoskeletal: Positive for back pain. Negative for arthralgias.  Skin: Negative for color change and rash.  Neurological: Negative for dizziness, seizures, syncope and light-headedness.  All other systems reviewed and are negative.   Physical Exam Updated Vital Signs BP 135/88 (BP Location: Left Arm)   Pulse 86   Temp 99.3 F (37.4 C) (Oral)   Resp 16   Ht 5\' 9"  (1.753 m)   Wt 103.4 kg   SpO2 97%   BMI 33.67 kg/m   Physical Exam Vitals and nursing note reviewed.  Constitutional:      General: He is not in acute distress.    Appearance: Normal appearance. He is not ill-appearing, toxic-appearing or diaphoretic.  HENT:     Head: Normocephalic.     Mouth/Throat:     Mouth: Mucous membranes are moist.  Eyes:     Conjunctiva/sclera: Conjunctivae normal.  Cardiovascular:     Rate and Rhythm: Normal rate and  regular rhythm.  Pulmonary:     Effort: Pulmonary effort is normal.     Breath sounds: Normal breath sounds.  Abdominal:     General: Abdomen is flat.     Palpations: Abdomen is soft.     Tenderness: There is abdominal tenderness. There is guarding. There is no rebound.     Hernia: No hernia is present.     Comments: Diffusely ttp but worse in the epigastrum, RUQ and RLQ  Skin:    General: Skin is dry.  Neurological:     Mental Status: He is alert.  Psychiatric:        Mood and Affect: Mood normal.     ED Results / Procedures / Treatments   Labs (all labs ordered are listed, but  only abnormal results are displayed) Labs Reviewed  CBC WITH DIFFERENTIAL/PLATELET - Abnormal; Notable for the following components:      Result Value   WBC 10.9 (*)    Neutro Abs 7.9 (*)    Monocytes Absolute 1.3 (*)    All other components within normal limits  COMPREHENSIVE METABOLIC PANEL - Abnormal; Notable for the following components:   Glucose, Bld 100 (*)    Calcium 8.5 (*)    All other components within normal limits  LIPASE, BLOOD    EKG None  Radiology CT Abdomen Pelvis W Contrast  Result Date: 07/04/2019 CLINICAL DATA:  Right-sided abdominal pain for 3 days. History of prior appendectomy. EXAM: CT ABDOMEN AND PELVIS WITH CONTRAST TECHNIQUE: Multidetector CT imaging of the abdomen and pelvis was performed using the standard protocol following bolus administration of intravenous contrast. CONTRAST:  OMNIPAQUE IOHEXOL 300 MG/ML  SOLN COMPARISON:  None. FINDINGS: Lower chest: Patchy bibasilar atelectasis. No infiltrates or effusions. The heart is normal in size. No pericardial effusion. The distal esophagus is unremarkable. Hepatobiliary: No focal hepatic lesions or intrahepatic biliary dilatation. The gallbladder is normal. No common bile duct dilatation. Pancreas: No mass, inflammation or ductal dilatation. Spleen: Normal size. No focal lesions. Adrenals/Urinary Tract: The adrenal glands and kidneys are unremarkable. No renal, ureteral or bladder calculi or mass. No findings for pyelonephritis. Stomach/Bowel: The stomach, duodenum, small bowel and colon are grossly normal without oral contrast. No acute inflammatory changes, mass lesions or obstructive findings. The terminal ileum is unremarkable. The appendix is surgically absent. Scattered colonic diverticulosis but no findings for acute diverticulitis. Vascular/Lymphatic: The aorta and branch vessels are normal. The major venous structures are patent. No mesenteric or retroperitoneal mass or adenopathy. Reproductive: The  prostate gland and seminal vesicles are unremarkable. Other: There is a focal area of inflammatory type interstitial changes in the omentum on the right side. Findings most likely secondary to a small omental infarct or epiploic appendagitis. This is a benign self-limited process. Musculoskeletal: No significant bony findings. IMPRESSION: 1. Focal area of inflammatory type interstitial changes in the omentum on the right side most likely secondary to a small omental infarct or epiploic appendagitis. 2. No other significant abdominal/pelvic findings, mass lesions or adenopathy. Electronically Signed   By: Rudie Meyer M.D.   On: 07/04/2019 17:11    Procedures Procedures (including critical care time)  Medications Ordered in ED Medications  sodium chloride 0.9 % bolus 1,000 mL (1,000 mLs Intravenous New Bag/Given 07/05/19 1251)  ketorolac (TORADOL) 15 MG/ML injection 15 mg (15 mg Intravenous Given 07/05/19 1307)    ED Course  I have reviewed the triage vital signs and the nursing notes.  Pertinent labs & imaging results that  were available during my care of the patient were reviewed by me and considered in my medical decision making (see chart for details).  Clinical Course as of Jul 05 1354  Sat Jul 05, 2019  1306 Patient presenting with abdominal pain.  He was diagnosed with epiploic appendagitis yesterday.  Given prescription for Norco.  Reports he took 1 dose of this and has not had a bowel movement since yesterday afternoon.  Patient appears well and is tolerating p.o. without nausea or vomiting. Labs reassuring. Patient was given fluids and toradol and had a bowel movement and felt improved. D/c with good return precautions. He was given a script for miralax and advised he may take norco for pain but this may worsen his constiaption. Discussed with Dr. Clarene Duke and plan agreed upon   [KM]    Clinical Course User Index [KM] Jeral Pinch   MDM Rules/Calculators/A&P                       Based on review of vitals, medical screening exam, lab work and/or imaging, there does not appear to be an acute, emergent etiology for the patient's symptoms. Counseled pt on good return precautions and encouraged both PCP and ED follow-up as needed.  Prior to discharge, I also discussed incidental imaging findings with patient in detail and advised appropriate, recommended follow-up in detail.  Clinical Impression: 1. Epiploic appendagitis     Disposition: Discharge  Prior to providing a prescription for a controlled substance, I independently reviewed the patient's recent prescription history on the [place] Controlled Substance Reporting System. The patient had no recent or regular prescriptions and was deemed appropriate for a brief, less than 3 day prescription of narcotic for acute analgesia.  This note was prepared with assistance of Conservation officer, historic buildings. Occasional wrong-word or sound-a-like substitutions may have occurred due to the inherent limitations of voice recognition software.  Final Clinical Impression(s) / ED Diagnoses Final diagnoses:  Epiploic appendagitis    Rx / DC Orders ED Discharge Orders         Ordered    polyethylene glycol (MIRALAX) 17 g packet  Daily     07/05/19 1354           Jeral Pinch 07/05/19 1356    Little, Ambrose Finland, MD 07/05/19 1404

## 2019-07-05 NOTE — ED Triage Notes (Signed)
Pt c/o RLQ pain X3-4 days states that he was seen here yesterday and pain is getting worse. Denies urinary symptoms, denies NVD.

## 2019-07-05 NOTE — ED Notes (Addendum)
Pt. Did not provide a urine sample from bathroom trip

## 2019-09-26 ENCOUNTER — Emergency Department (HOSPITAL_BASED_OUTPATIENT_CLINIC_OR_DEPARTMENT_OTHER)
Admission: EM | Admit: 2019-09-26 | Discharge: 2019-09-26 | Disposition: A | Discharge status: Home / Self Care | Payer: Self-pay | Attending: Emergency Medicine | Admitting: Emergency Medicine

## 2019-09-26 ENCOUNTER — Emergency Department (HOSPITAL_BASED_OUTPATIENT_CLINIC_OR_DEPARTMENT_OTHER): Payer: Self-pay

## 2019-09-26 ENCOUNTER — Other Ambulatory Visit: Payer: Self-pay

## 2019-09-26 ENCOUNTER — Encounter (HOSPITAL_BASED_OUTPATIENT_CLINIC_OR_DEPARTMENT_OTHER): Payer: Self-pay

## 2019-09-26 DIAGNOSIS — M25512 Pain in left shoulder: Secondary | ICD-10-CM | POA: Insufficient documentation

## 2019-09-26 DIAGNOSIS — Y999 Unspecified external cause status: Secondary | ICD-10-CM | POA: Insufficient documentation

## 2019-09-26 DIAGNOSIS — Y92481 Parking lot as the place of occurrence of the external cause: Secondary | ICD-10-CM | POA: Insufficient documentation

## 2019-09-26 DIAGNOSIS — M25562 Pain in left knee: Secondary | ICD-10-CM | POA: Insufficient documentation

## 2019-09-26 DIAGNOSIS — M25532 Pain in left wrist: Secondary | ICD-10-CM | POA: Insufficient documentation

## 2019-09-26 DIAGNOSIS — M25522 Pain in left elbow: Secondary | ICD-10-CM | POA: Insufficient documentation

## 2019-09-26 DIAGNOSIS — R000 Tachycardia, unspecified: Secondary | ICD-10-CM | POA: Insufficient documentation

## 2019-09-26 DIAGNOSIS — M25572 Pain in left ankle and joints of left foot: Secondary | ICD-10-CM | POA: Insufficient documentation

## 2019-09-26 DIAGNOSIS — Z87891 Personal history of nicotine dependence: Secondary | ICD-10-CM | POA: Insufficient documentation

## 2019-09-26 DIAGNOSIS — Y93I9 Activity, other involving external motion: Secondary | ICD-10-CM | POA: Insufficient documentation
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
## 2019-09-26 MED ORDER — ACETAMINOPHEN 325 MG PO TABS
650.0000 mg | ORAL_TABLET | Freq: Once | ORAL | Status: AC
  Administered 2019-09-26: 650 mg via ORAL
  Filled 2019-09-26: qty 2

## 2019-09-26 MED ORDER — CYCLOBENZAPRINE HCL 10 MG PO TABS: 10.0000 mg | ORAL_TABLET | Freq: Every day | ORAL | 0 refills | Status: AC

## 2019-09-26 MED FILL — CYCLOBENZAPRINE HCL 10 MG T: 10 | 5 days supply | Qty: 5 | Fill #0
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
## 2019-12-02 ENCOUNTER — Other Ambulatory Visit: Payer: Self-pay | Admitting: Chiropractic Medicine

## 2019-12-02 DIAGNOSIS — M7542 Impingement syndrome of left shoulder: Secondary | ICD-10-CM

## 2019-12-02 DIAGNOSIS — M25532 Pain in left wrist: Secondary | ICD-10-CM

## 2020-01-11 ENCOUNTER — Other Ambulatory Visit: Payer: Self-pay

## 2020-01-11 ENCOUNTER — Ambulatory Visit
Admission: RE | Admit: 2020-01-11 | Discharge: 2020-01-11 | Disposition: A | Discharge status: Home / Self Care | Payer: No Typology Code available for payment source | Source: Ambulatory Visit | Attending: Chiropractic Medicine | Admitting: Chiropractic Medicine

## 2020-01-11 DIAGNOSIS — M25532 Pain in left wrist: Secondary | ICD-10-CM

## 2020-01-11 DIAGNOSIS — M7542 Impingement syndrome of left shoulder: Secondary | ICD-10-CM
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
## 2020-04-13 ENCOUNTER — Other Ambulatory Visit: Payer: Self-pay

## 2020-04-13 ENCOUNTER — Encounter (HOSPITAL_BASED_OUTPATIENT_CLINIC_OR_DEPARTMENT_OTHER): Payer: Self-pay

## 2020-04-13 DIAGNOSIS — R1031 Right lower quadrant pain: Secondary | ICD-10-CM | POA: Insufficient documentation

## 2020-04-13 DIAGNOSIS — Z87891 Personal history of nicotine dependence: Secondary | ICD-10-CM | POA: Insufficient documentation

## 2020-04-13 LAB — URINALYSIS, ROUTINE W REFLEX MICROSCOPIC
Bilirubin Urine: NEGATIVE
Glucose, UA: NEGATIVE mg/dL
Hgb urine dipstick: NEGATIVE
Ketones, ur: NEGATIVE mg/dL
Leukocytes,Ua: NEGATIVE
Nitrite: NEGATIVE
Protein, ur: NEGATIVE mg/dL
Specific Gravity, Urine: 1.02 (ref 1.005–1.030)
pH: 6.5 (ref 5.0–8.0)

## 2020-04-13 LAB — CBC
HCT: 45 % (ref 39.0–52.0)
Hemoglobin: 15.7 g/dL (ref 13.0–17.0)
MCH: 30.8 pg (ref 26.0–34.0)
MCHC: 34.9 g/dL (ref 30.0–36.0)
MCV: 88.2 fL (ref 80.0–100.0)
Platelets: 281 10*3/uL (ref 150–400)
RBC: 5.1 MIL/uL (ref 4.22–5.81)
RDW: 12 % (ref 11.5–15.5)
WBC: 6.9 10*3/uL (ref 4.0–10.5)
nRBC: 0 % (ref 0.0–0.2)

## 2020-04-13 LAB — COMPREHENSIVE METABOLIC PANEL
ALT: 16 U/L (ref 0–44)
AST: 19 U/L (ref 15–41)
Albumin: 4.7 g/dL (ref 3.5–5.0)
Alkaline Phosphatase: 43 U/L (ref 38–126)
Anion gap: 9 (ref 5–15)
BUN: 14 mg/dL (ref 6–20)
CO2: 23 mmol/L (ref 22–32)
Calcium: 9 mg/dL (ref 8.9–10.3)
Chloride: 104 mmol/L (ref 98–111)
Creatinine, Ser: 1.05 mg/dL (ref 0.61–1.24)
GFR, Estimated: >60 mL/min (ref >60)
Glucose, Bld: 103 mg/dL — ABNORMAL HIGH (ref 70–99)
Potassium: 3.7 mmol/L (ref 3.5–5.1)
Sodium: 136 mmol/L (ref 135–145)
Total Bilirubin: 1.4 mg/dL — ABNORMAL HIGH (ref 0.3–1.2)
Total Protein: 7.6 g/dL (ref 6.5–8.1)

## 2020-04-13 LAB — LIPASE, BLOOD: Lipase: 31 U/L (ref 11–51)

## 2020-04-13 NOTE — ED Triage Notes (Signed)
Pt c/o lower abd, lower back, right groin/testicle pain x 2 days-NAD-steady gait

## 2020-04-14 ENCOUNTER — Emergency Department (HOSPITAL_BASED_OUTPATIENT_CLINIC_OR_DEPARTMENT_OTHER)
Admission: EM | Admit: 2020-04-14 | Discharge: 2020-04-14 | Disposition: A | Discharge status: Home / Self Care | Payer: Medicaid Other | Attending: Emergency Medicine | Admitting: Emergency Medicine

## 2020-04-14 ENCOUNTER — Emergency Department (HOSPITAL_BASED_OUTPATIENT_CLINIC_OR_DEPARTMENT_OTHER): Payer: Medicaid Other

## 2020-04-14 DIAGNOSIS — R109 Unspecified abdominal pain: Secondary | ICD-10-CM

## 2020-04-14 IMAGING — CT CT RENAL STONE PROTOCOL
2 of 4 series · 17 of 46 positions shown, 19 images · non-contrast
Comparison: Prior CT from [DATE].

CLINICAL DATA: Initial evaluation for acute flank pain, kidney
stone suspected.

EXAM:
CT ABDOMEN AND PELVIS WITHOUT CONTRAST
TECHNIQUE: Multidetector CT imaging of the abdomen and pelvis was performed
following the standard protocol without IV contrast.

[Series 2: axial st · axial · 0.81mm/px · z∈[+502,+932]mm · 14 of 96 slices shown, 16 images]
[im 5/96  soft-tissue]
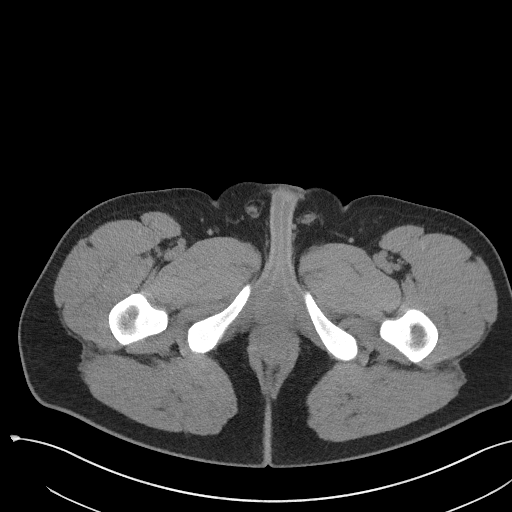
[im 5/96  bone]
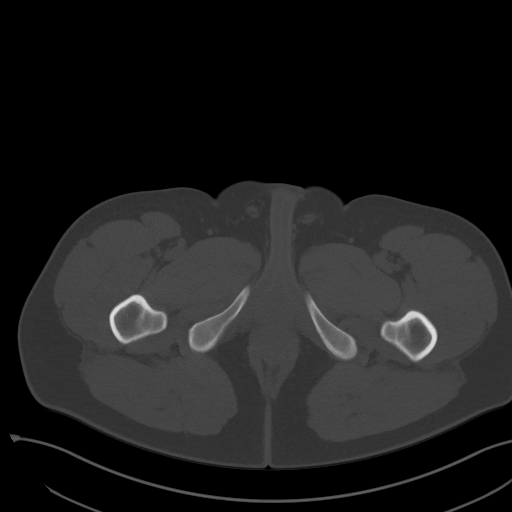
[im 13/96  soft-tissue]
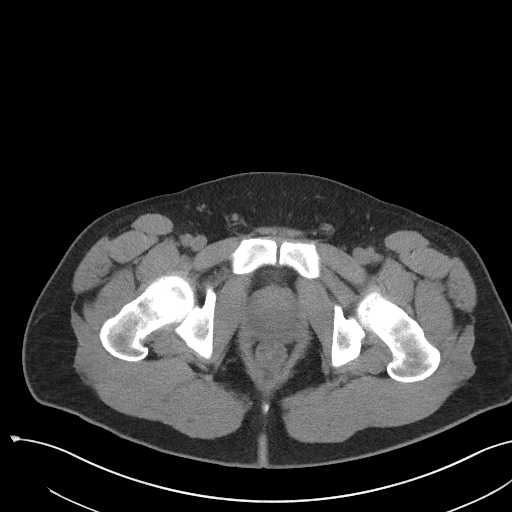
[im 18/96  soft-tissue]
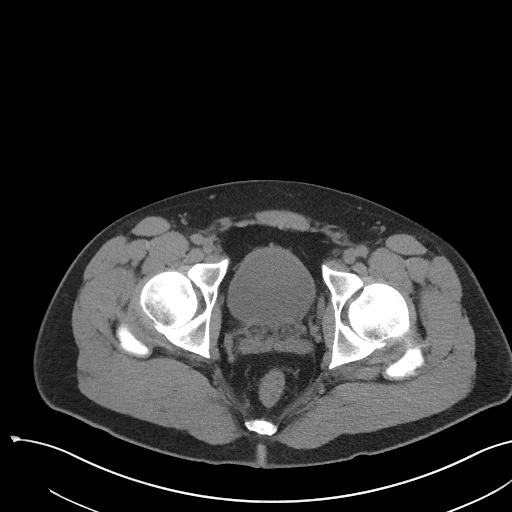
[im 26/96  soft-tissue]
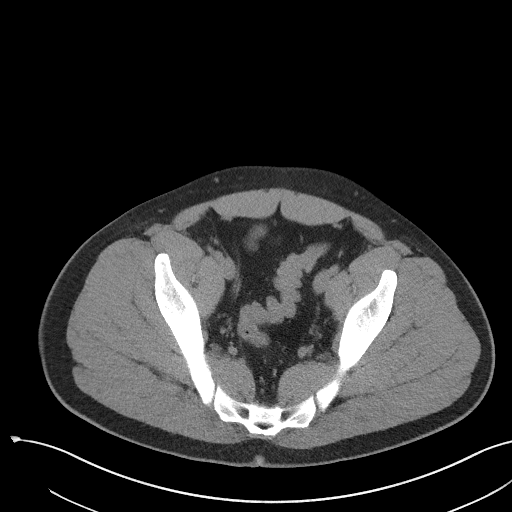
[im 31/96  soft-tissue]
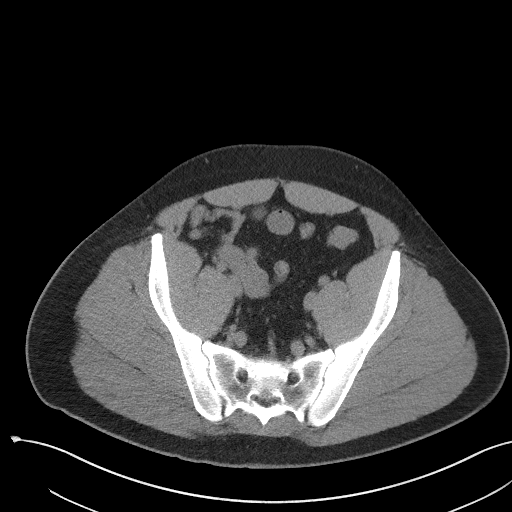
[im 39/96  soft-tissue]
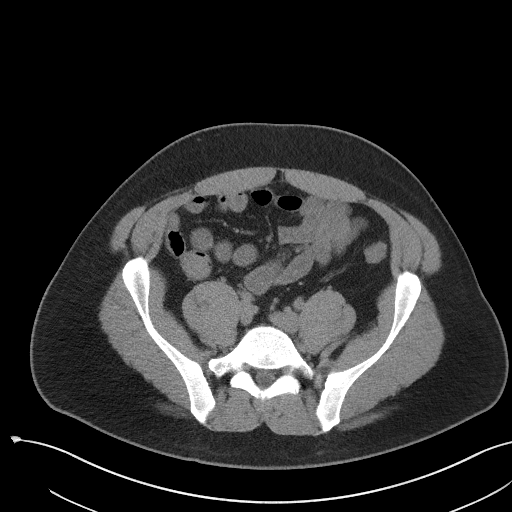
[im 44/96  soft-tissue]
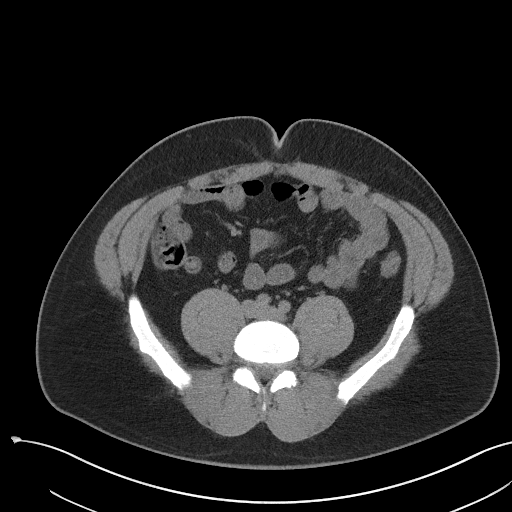
[im 52/96  soft-tissue]
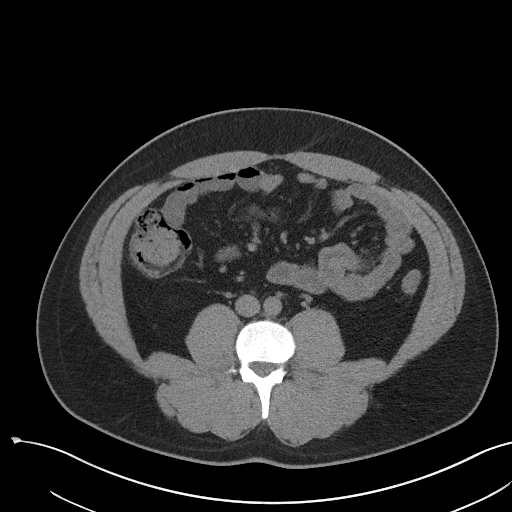
[im 57/96  soft-tissue]
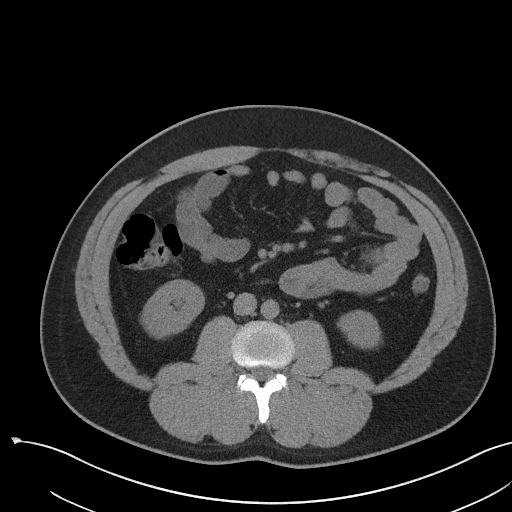
[im 57/96  bone]
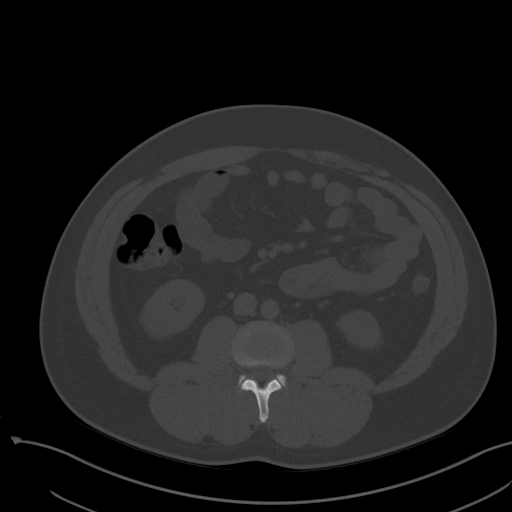
[im 65/96  soft-tissue]
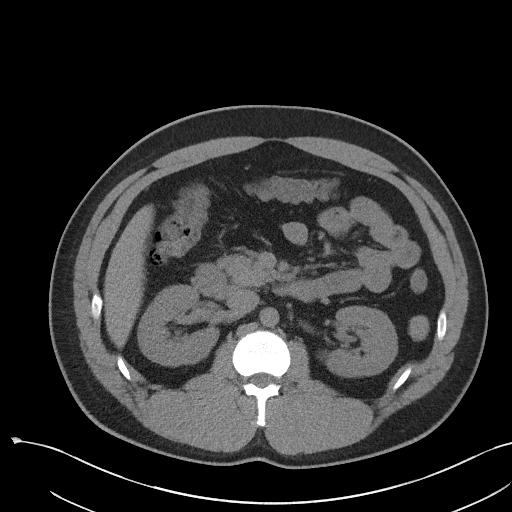
[im 70/96  soft-tissue]
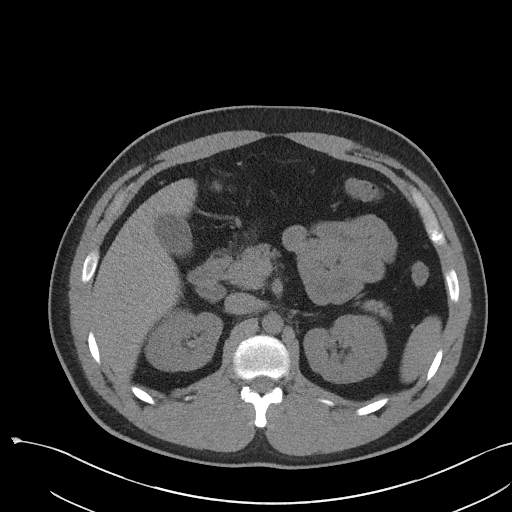
[im 78/96  soft-tissue]
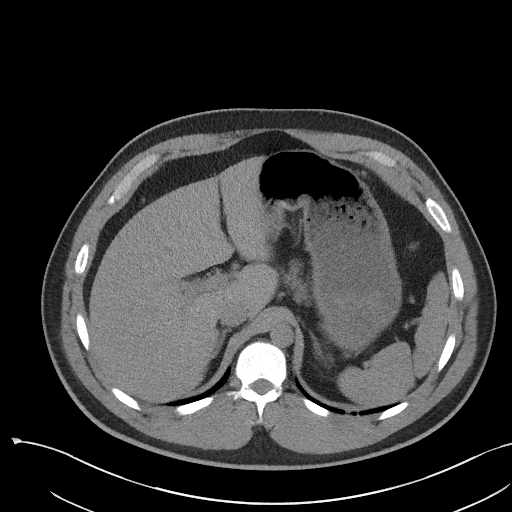
[im 83/96  soft-tissue]
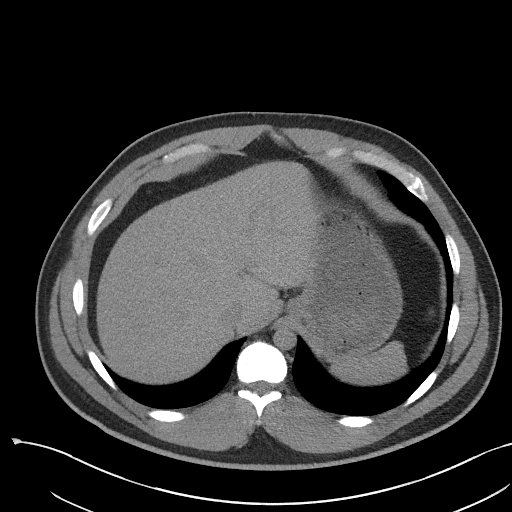
[im 91/96  soft-tissue]
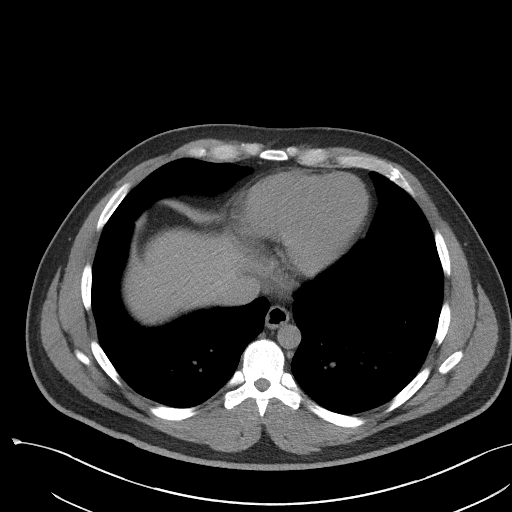

[Series 5: coronal st · coronal · 0.81mm/px · 3 of 97 slices shown]
[im 33/97  soft-tissue]
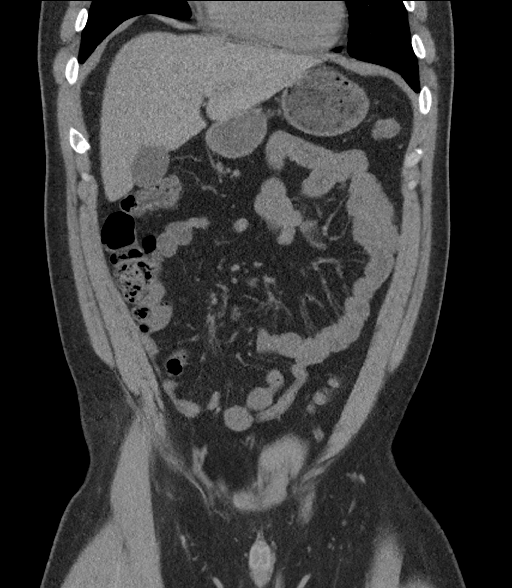
[im 43/97  soft-tissue]
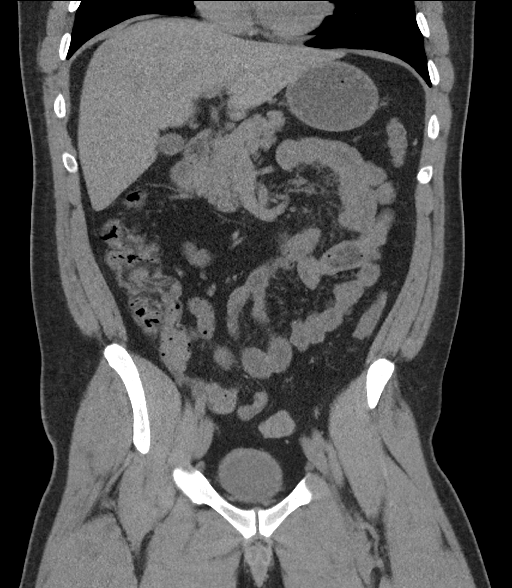
[im 54/97  soft-tissue]
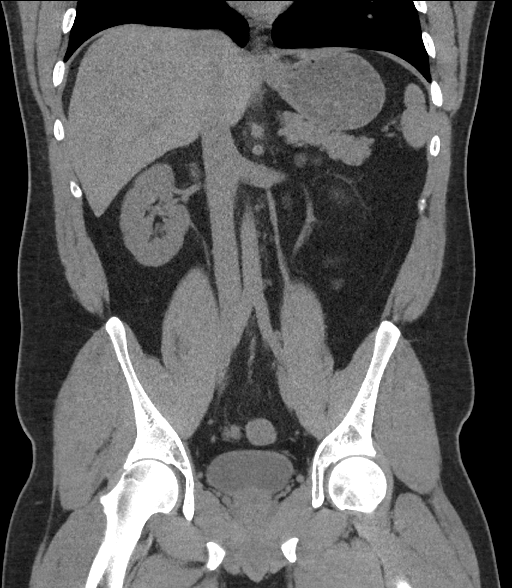

[17 of 46 positions shown; findings below may reference images not displayed]

FINDINGS: Lower chest: Visualized lung bases are clear.

Hepatobiliary: Limited noncontrast evaluation liver is unremarkable.
Gallbladder within normal limits. No biliary dilatation.

Pancreas: Pancreas within normal limits.

Spleen: Spleen within normal limits.

Adrenals/Urinary Tract: Adrenal glands are normal. Kidneys equal in
size without nephrolithiasis or hydronephrosis. No radiopaque
calculi seen along the course of either renal collecting system. No
hydroureter. Partially distended bladder within normal limits. No
layering stones within the bladder lumen.

Stomach/Bowel: Stomach within normal limits. No evidence for bowel
obstruction. No acute inflammatory changes seen about the bowels.
Appendix not visualize, consistent with prior appendectomy.

Vascular/Lymphatic: Intra-abdominal aorta of normal caliber. No
adenopathy.

Reproductive: Prostate within normal limits.

Other: No free air or fluid.

Musculoskeletal: No acute osseous abnormality. No discrete or
worrisome osseous lesions.
IMPRESSION: 1. No CT evidence for nephrolithiasis or obstructive uropathy.
2. No other acute intra-abdominal or pelvic process identified.

## 2020-04-14 MED ORDER — NAPROXEN 250 MG PO TABS
500.0000 mg | ORAL_TABLET | Freq: Once | ORAL | Status: AC
  Administered 2020-04-14: 500 mg via ORAL
  Filled 2020-04-14: qty 2

## 2020-04-14 NOTE — ED Notes (Signed)
Taken to CT.

## 2020-04-14 NOTE — ED Provider Notes (Signed)
[facility] Emergency Department Provider Note MRN:  [id number]  Arrival date & time: 04/14/20     Chief Complaint   Abdominal Pain   History of Present Illness   Wesley Contreras is a 30 y.o. year-old male with a history of appendectomy presenting to the ED with chief complaint of abdominal.  Location: Right lower quadrant and right flank Duration: 1 to 2 days Onset: Gradual Timing: Constant Description: Dull Severity: Mild to Exacerbating/Alleviating Factors: None Associated Symptoms: None Pertinent Negatives: Denies nausea vomiting, no fever, no dysuria, no hematuria, no chest pain or shortness of breath   Review of Systems  A complete 10 system review of systems was obtained and all systems are negative except as noted in the HPI and PMH.   Patient's Health History   History reviewed. No pertinent past medical history.  Past Surgical History:  Procedure Laterality Date  . APPENDECTOMY      No family history on file.  Social History   Socioeconomic History  . Marital status: Single    Spouse name: Not on file  . Number of children: Not on file  . Years of education: Not on file  . Highest education level: Not on file  Occupational History  . Not on file  Tobacco Use  . Smoking status: Former Smoker    Types: Cigarettes  . Smokeless tobacco: Never Used  Vaping Use  . Vaping Use: Never used  Substance and Sexual Activity  . Alcohol use: Not Currently  . Drug use: No  . Sexual activity: Not on file  Other Topics Concern  . Not on file  Social History Narrative  . Not on file   Social Determinants of Health   Financial Resource Strain: Not on file  Food Insecurity: Not on file  Transportation Needs: Not on file  Physical Activity: Not on file  Stress: Not on file  Social Connections: Not on file  Intimate Partner Violence: Not on file     Physical Exam   Vitals:   04/13/20 2212 04/14/20 0321  BP: (!) 151/93  140/87  Pulse: 84 81  Resp: 20 16  Temp: 98.2 F (36.8 C)   SpO2: 100% 100%    CONSTITUTIONAL: Well-appearing, NAD NEURO:  Alert and oriented x 3, no focal deficits EYES:  eyes equal and reactive ENT/NECK:  no LAD, no JVD CARDIO: Regular rate, well-perfused, normal S1 and S2 PULM:  CTAB no wheezing or rhonchi GI/GU:  normal bowel sounds, non-distended, mild right lower quadrant tenderness to palpation MSK/SPINE:  No gross deformities, no edema SKIN:  no rash, atraumatic PSYCH:  Appropriate speech and behavior  *Additional and/or pertinent findings included in MDM below  Diagnostic and Interventional Summary    EKG Interpretation  Date/Time:    Ventricular Rate:    PR Interval:    QRS Duration:   QT Interval:    QTC Calculation:   R Axis:     Text Interpretation:        Labs Reviewed  COMPREHENSIVE METABOLIC PANEL - Abnormal; Notable for the following components:      Result Value   Glucose, Bld 103 (*)    Total Bilirubin 1.4 (*)    All other components within normal limits  LIPASE, BLOOD  CBC  URINALYSIS, ROUTINE W REFLEX MICROSCOPIC    CT RENAL STONE STUDY  Final Result      Medications  naproxen (NAPROSYN) tablet 500 mg (500 mg Oral Given 04/14/20 0320)  Procedures  /  Critical Care Procedures  ED Course and Medical Decision Making  I have reviewed the triage vital signs, the nursing notes, and pertinent available records from the EMR.  Listed above are laboratory and imaging tests that I personally ordered, reviewed, and interpreted and then considered in my medical decision making (see below for details).  Suspect kidney stone given the pain in the right lower quadrant, right flank, some discomfort noted in the right testicle.  Urinalysis without blood and so with this diagnostic uncertainty will obtain CT renal.    CT is reassuring, patient is in no acute distress, appropriate for discharge.   Elmer Sow. Pilar Plate, MD [facility] Emergency  Medicine [facility] [email or web address]  Final Clinical Impressions(s) / ED Diagnoses     ICD-10-CM   1. Abdominal pain, unspecified abdominal location  R10.9     ED Discharge Orders    None       Discharge Instructions Discussed with and Provided to Patient:     Discharge Instructions     You were evaluated in the Emergency Department and after careful evaluation, we did not find any emergent condition requiring admission or further testing in the hospital.  Your exam/testing today was overall reassuring.  Symptoms may be due to constipation.  Please continue using MiraLAX at home and follow-up with your regular doctor.  Please return to the Emergency Department if you experience any worsening of your condition.  Thank you for allowing [place] to be a part of your care.        Sabas Sous, MD 04/14/20 [phone]

## 2020-04-14 NOTE — ED Notes (Signed)
Return to room

## 2020-04-14 NOTE — Discharge Instructions (Addendum)
You were evaluated in the Emergency Department and after careful evaluation, we did not find any emergent condition requiring admission or further testing in the hospital.  Your exam/testing today was overall reassuring.  Symptoms may be due to constipation.  Please continue using Mira[employer]X at home and follow-up with your regular doctor.  Please return to the Emergency Department if you experience any worsening of your condition.  Thank you for allowing [place] to be a part of your care.
